# Patient Record
Sex: Female | Born: 1985 | Race: Black or African American | Hispanic: No | Marital: Single | Smoking: Never smoker
Health system: Southern US, Community
[De-identification: ages and names within clinical notes are randomized; demographics above are authoritative.]

## PROBLEM LIST (undated history)

## (undated) ENCOUNTER — Emergency Department (HOSPITAL_COMMUNITY): Admission: EM

## (undated) DIAGNOSIS — G40909 Epilepsy, unspecified, not intractable, without status epilepticus: Secondary | ICD-10-CM

## (undated) DIAGNOSIS — I10 Essential (primary) hypertension: Secondary | ICD-10-CM

## (undated) DIAGNOSIS — G4730 Sleep apnea, unspecified: Secondary | ICD-10-CM

## (undated) DIAGNOSIS — R569 Unspecified convulsions: Secondary | ICD-10-CM

## (undated) HISTORY — PX: TONSILLECTOMY AND ADENOIDECTOMY: SUR1326

## (undated) HISTORY — DX: Essential (primary) hypertension: I10

## (undated) HISTORY — DX: Morbid (severe) obesity due to excess calories: E66.01

---
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
## 2009-06-03 ENCOUNTER — Encounter: Admission: RE | Admit: 2009-06-03 | Discharge: 2009-06-03 | Payer: Self-pay | Admitting: Family Medicine

## 2009-10-20 ENCOUNTER — Encounter
Admission: RE | Admit: 2009-10-20 | Discharge: 2009-10-20 | Payer: Self-pay | Source: Home / Self Care | Attending: Family Medicine | Admitting: Family Medicine

## 2011-04-07 ENCOUNTER — Ambulatory Visit (HOSPITAL_COMMUNITY)
Admission: RE | Admit: 2011-04-07 | Discharge: 2011-04-07 | Disposition: A | Discharge status: Home / Self Care | Payer: Medicaid Other | Source: Ambulatory Visit | Attending: Internal Medicine | Admitting: Internal Medicine

## 2011-04-07 ENCOUNTER — Other Ambulatory Visit: Payer: Self-pay | Admitting: Internal Medicine

## 2011-04-07 DIAGNOSIS — R0602 Shortness of breath: Secondary | ICD-10-CM

## 2011-04-07 DIAGNOSIS — R079 Chest pain, unspecified: Secondary | ICD-10-CM | POA: Insufficient documentation

## 2011-05-16 ENCOUNTER — Encounter (HOSPITAL_COMMUNITY): Payer: Self-pay | Admitting: *Deleted

## 2011-05-16 ENCOUNTER — Emergency Department (HOSPITAL_COMMUNITY)
Admission: EM | Admit: 2011-05-16 | Discharge: 2011-05-16 | Disposition: A | Discharge status: Home / Self Care | Payer: Medicaid Other | Attending: Emergency Medicine | Admitting: Emergency Medicine

## 2011-05-16 DIAGNOSIS — W57XXXA Bitten or stung by nonvenomous insect and other nonvenomous arthropods, initial encounter: Secondary | ICD-10-CM

## 2011-05-16 DIAGNOSIS — L989 Disorder of the skin and subcutaneous tissue, unspecified: Secondary | ICD-10-CM | POA: Insufficient documentation

## 2011-05-16 HISTORY — DX: Epilepsy, unspecified, not intractable, without status epilepticus: G40.909

## 2011-05-16 HISTORY — DX: Unspecified convulsions: R56.9

## 2011-05-16 MED ORDER — HYDROCORTISONE 2.5 % EX LOTN: TOPICAL_LOTION | Freq: Two times a day (BID) | CUTANEOUS | Status: AC

## 2011-05-16 NOTE — ED Provider Notes (Signed)
History     CSN: [id number]  Arrival date & time 05/16/11  1243   First MD Initiated Contact with Patient 05/16/11 1352      2:12 PM HPI Shannon Huffman is a 26 y.o. female complaining of an insect bite. Reports she woke up this morning to find a itchy spot on her right forearm and right breast. Denies purulent drainage. Reports the spot on her right breast has resolved but the her forearm continues to itch. Denies change in soaps, lotions, detergencts, allergies, food contacts or sick contacts. Denies fever, SOB, wheezing, or cough  Patient is a 26 y.o. female presenting with rash. The history is provided by the patient.  Rash  This is a new problem. The problem has been gradually improving. The problem is associated with an unknown factor. There has been no fever. The rash is present on the right arm (right breast). The pain is at a severity of 0/10. Associated symptoms include itching. Pertinent negatives include no blisters, no pain and no weeping.    Past Medical History  Diagnosis Date  . Asthma   . Seizures   . Epilepsy     History reviewed. No pertinent past surgical history.  History reviewed. No pertinent family history.  History  Substance Use Topics  . Smoking status: Not on file  . Smokeless tobacco: Not on file  . Alcohol Use: No    OB History    Grav Para Term Preterm Abortions TAB SAB Ect Mult Living                  Review of Systems  Constitutional: Negative for fever and chills.  HENT: Negative for rhinorrhea.   Eyes: Negative for redness.  Respiratory: Negative for cough, shortness of breath and wheezing.   Cardiovascular: Negative for chest pain and palpitations.  Gastrointestinal: Negative for nausea.  Skin: Positive for itching and rash (insect bite).       Pruritus  All other systems reviewed and are negative.    Allergies  Review of patient's allergies indicates no known allergies.  Home Medications  No current outpatient  prescriptions on file.  BP 132/84  Pulse 84  Temp(Src) 98.9 F (37.2 C) (Oral)  Resp 20  SpO2 100%  LMP 03/23/2011  Physical Exam  Vitals reviewed. Constitutional: She is oriented to person, place, and time. Vital signs are normal. She appears well-developed and well-nourished. No distress.  HENT:  Head: Normocephalic and atraumatic.  Eyes: Pupils are equal, round, and reactive to light.  Neck: Neck supple.  Pulmonary/Chest: Effort normal.  Neurological: She is alert and oriented to person, place, and time.  Skin: Skin is warm and dry. No rash noted. No erythema. No pallor.       Pt has a large erythematous lesion on right anterior forearm. No fluctuance, masses or drainage. Right breast: no lesion seen  Psychiatric: She has a normal mood and affect. Her behavior is normal.    ED Course  Procedures   MDM   Will d/c with Hydrocortisone cream and advised return for worsening symptoms if needed. Rash/insect bite does not appear to be infected. Advised against antibiotic treatment at this time. Pt voices understanding and is ready for d/c      Thomasene Lot, PA-C 05/16/11 1420

## 2011-05-16 NOTE — ED Notes (Signed)
Pt reports waking up this morning and noticing bite mark and swelling to right forearm and right breast. Pt reports itching to areas.

## 2011-05-16 NOTE — Discharge Instructions (Signed)
Insect Bite Mosquitoes, flies, fleas, bedbugs, and many other insects can bite. Insect bites are different from insect stings. A sting is when venom is injected into the skin. Some insect bites can transmit infectious diseases. SYMPTOMS  Insect bites usually turn red, swell, and itch for 2 to 4 days. They often go away on their own. TREATMENT  Your caregiver may prescribe antibiotic medicines if a bacterial infection develops in the bite. HOME CARE INSTRUCTIONS  Do not scratch the bite area.   Keep the bite area clean and dry. Wash the bite area thoroughly with soap and water.   Put ice or cool compresses on the bite area.   Put ice in a plastic bag.   Place a towel between your skin and the bag.   Leave the ice on for 20 minutes, 4 times a day for the first 2 to 3 days, or as directed.   You may apply a baking soda paste, cortisone cream, or calamine lotion to the bite area as directed by your caregiver. This can help reduce itching and swelling.   Only take over-the-counter or prescription medicines as directed by your caregiver.   If you are given antibiotics, take them as directed. Finish them even if you start to feel better.  You may need a tetanus shot if:  You cannot remember when you had your last tetanus shot.   You have never had a tetanus shot.   The injury broke your skin.  If you get a tetanus shot, your arm may swell, get red, and feel warm to the touch. This is common and not a problem. If you need a tetanus shot and you choose not to have one, there is a rare chance of getting tetanus. Sickness from tetanus can be serious. SEEK IMMEDIATE MEDICAL CARE IF:   You have increased pain, redness, or swelling in the bite area.   You see a red line on the skin coming from the bite.   You have a fever.   You have joint pain.   You have a headache or neck pain.   You have unusual weakness.   You have a rash.   You have chest pain or shortness of breath.   You  have abdominal pain, nausea, or vomiting.   You feel unusually tired or sleepy.  MAKE SURE YOU:   Understand these instructions.   Will watch your condition.   Will get help right away if you are not doing well or get worse.  Document Released: 02/12/2004 Document Revised: 12/24/2010 Document Reviewed: 08/05/2010 [employer] Patient Information 2012 [employer]. 

## 2011-05-18 NOTE — ED Provider Notes (Signed)
Medical screening examination/treatment/procedure(s) were performed by non-physician practitioner and as supervising physician I was immediately available for consultation/collaboration.  Raeford Razor, MD 05/18/11 [phone]

## 2011-10-14 ENCOUNTER — Encounter (HOSPITAL_COMMUNITY): Payer: Self-pay | Admitting: Emergency Medicine

## 2011-10-14 ENCOUNTER — Emergency Department (HOSPITAL_COMMUNITY)
Admission: EM | Admit: 2011-10-14 | Discharge: 2011-10-14 | Disposition: A | Discharge status: Home / Self Care | Payer: Medicaid Other | Attending: Emergency Medicine | Admitting: Emergency Medicine

## 2011-10-14 ENCOUNTER — Emergency Department (HOSPITAL_COMMUNITY): Payer: Medicaid Other

## 2011-10-14 DIAGNOSIS — G40909 Epilepsy, unspecified, not intractable, without status epilepticus: Secondary | ICD-10-CM | POA: Insufficient documentation

## 2011-10-14 DIAGNOSIS — M25561 Pain in right knee: Secondary | ICD-10-CM

## 2011-10-14 DIAGNOSIS — M25569 Pain in unspecified knee: Secondary | ICD-10-CM | POA: Insufficient documentation

## 2011-10-14 DIAGNOSIS — J45909 Unspecified asthma, uncomplicated: Secondary | ICD-10-CM | POA: Insufficient documentation

## 2011-10-14 DIAGNOSIS — G4730 Sleep apnea, unspecified: Secondary | ICD-10-CM | POA: Insufficient documentation

## 2011-10-14 HISTORY — DX: Sleep apnea, unspecified: G47.30

## 2011-10-14 MED ORDER — TRAMADOL HCL 50 MG PO TABS: 50.0000 mg | ORAL_TABLET | Freq: Four times a day (QID) | ORAL | Status: DC | PRN

## 2011-10-14 MED ORDER — TRIAMCINOLONE ACETONIDE 0.1 % EX CREA: TOPICAL_CREAM | Freq: Two times a day (BID) | CUTANEOUS | Status: DC

## 2011-10-14 MED ORDER — OXYCODONE-ACETAMINOPHEN 5-325 MG PO TABS
2.0000 | ORAL_TABLET | Freq: Once | ORAL | Status: AC
  Administered 2011-10-14: 2 via ORAL
  Filled 2011-10-14: qty 2

## 2011-10-14 NOTE — ED Provider Notes (Signed)
Medical screening examination/treatment/procedure(s) were performed by non-physician practitioner and as supervising physician I was immediately available for consultation/collaboration.   Lyanne Co, MD 10/14/11 [phone]

## 2011-10-14 NOTE — ED Provider Notes (Signed)
History     CSN: [id number]  Arrival date & time 10/14/11  4098   First MD Initiated Contact with Patient 10/14/11 1005      Chief Complaint  Patient presents with  . Knee Pain    (Consider location/radiation/quality/duration/timing/severity/associated sxs/prior treatment) HPI Comments: Shannon Huffman 26 y.o. female   The chief complaint is: Patient presents with:   Knee Pain   The patient has medical history significant for:   Past Medical History:   Asthma                                                       Seizures                                                     Epilepsy                                                     Sleep apnea                                                 Patient presents with right knee pain X 1 month. Patient states that she was doing jumping jacks and came down when her knee gave out on her. Patient describes the pain mostly at the patella, 8/10, without radiation or transmission. She has tried 400mg  of ibuprofen and bengay without relief. Denies fever or chills. Denies NVD or abdominal pain. Denies dysuria, vaginal discharge. Denies joint swelling or gait issue.      The history is provided by the patient. No language interpreter was used.    Past Medical History  Diagnosis Date  . Asthma   . Seizures   . Epilepsy   . Sleep apnea     History reviewed. No pertinent past surgical history.  History reviewed. No pertinent family history.  History  Substance Use Topics  . Smoking status: Not on file  . Smokeless tobacco: Not on file  . Alcohol Use: No    OB History    Grav Para Term Preterm Abortions TAB SAB Ect Mult Living                  Review of Systems  Constitutional: Negative for fever and chills.  Gastrointestinal: Negative for nausea, vomiting, abdominal pain and diarrhea.  Genitourinary: Negative for dysuria and vaginal discharge.  Musculoskeletal: Positive for arthralgias. Negative for joint  swelling.  All other systems reviewed and are negative.    Allergies  Adhesive and Latex  Home Medications   Current Outpatient Rx  Name Route Sig Dispense Refill  . ACETAMINOPHEN 500 MG PO TABS Oral Take 500 mg by mouth every 6 (six) hours as needed. For pain    . ALBUTEROL SULFATE HFA 108 (90 BASE) MCG/ACT IN AERS Inhalation Inhale 2 puffs into the lungs every 6 (six) hours as needed. For  shortness of breath    . FERROUS GLUCONATE 324 (38 FE) MG PO TABS Oral Take 324 mg by mouth daily with breakfast.    . HYDROCORTISONE 2.5 % EX LOTN Topical Apply topically 2 (two) times daily. 59 mL 0  . IBUPROFEN 200 MG PO TABS Oral Take 400 mg by mouth every 6 (six) hours as needed. For pain    . MOMETASONE FUROATE 50 MCG/ACT NA SUSP Nasal Place 2 sprays into the nose daily.    . ADULT MULTIVITAMIN W/MINERALS CH Oral Take 1 tablet by mouth daily.    Marland Kitchen PHENTERMINE HCL 37.5 MG PO TABS Oral Take 37.5 mg by mouth daily before breakfast.      BP 156/96  Pulse 106  Temp 99.1 F (37.3 C) (Oral)  Resp 18  Ht 5\' 7"  (1.702 m)  Wt 335 lb (151.955 kg)  BMI 52.47 kg/m2  SpO2 100%  LMP 07/14/2011  Physical Exam  Nursing note and vitals reviewed. Constitutional: She appears well-developed and well-nourished. No distress.  HENT:  Head: Normocephalic and atraumatic.  Eyes: Conjunctivae normal and EOM are normal. No scleral icterus.  Neck: Normal range of motion. Neck supple.  Cardiovascular: Regular rhythm and normal heart sounds.   Pulmonary/Chest: Effort normal and breath sounds normal.  Abdominal: Soft. Bowel sounds are normal. There is no tenderness.  Musculoskeletal: Normal range of motion. She exhibits tenderness. She exhibits no edema.       Tenderness to palpation of the anterior patella. No erythema or increased warmth.  Neurological: She is alert.  Skin: Skin is warm and dry.    ED Course  Procedures (including critical care time)  Labs Reviewed - No data to display No results  found.   1. Right knee pain      MDM  Patient presented with right knee pain x 1 month s/p exercise. Patient given pain medication in ED with mild improvement. Patient given instructions on rest, ice, and elevation with Rx for antiinflammatory, with referral to ortho on call if she does not improve in 5 days. Return precautions given verbally and in discharge summary. Imaging unremarkable. No red flags for fracture, dislocation, septic joint, or Reiter's syndrome.        Pixie Casino, PA-C 10/14/11 1323

## 2011-10-14 NOTE — ED Notes (Addendum)
Pt injured knee while exercising several weeks ago.  Pt has used OTC Tylenol and Ibuprofen and Bengay without relief.  Pt able to ambulate but with increased pain.

## 2012-01-03 ENCOUNTER — Ambulatory Visit: Payer: Medicaid Other | Attending: Specialist | Admitting: Physical Therapy

## 2012-01-03 DIAGNOSIS — M25669 Stiffness of unspecified knee, not elsewhere classified: Secondary | ICD-10-CM | POA: Insufficient documentation

## 2012-01-03 DIAGNOSIS — M25569 Pain in unspecified knee: Secondary | ICD-10-CM | POA: Insufficient documentation

## 2012-01-03 DIAGNOSIS — IMO0001 Reserved for inherently not codable concepts without codable children: Secondary | ICD-10-CM | POA: Insufficient documentation

## 2012-01-05 ENCOUNTER — Ambulatory Visit: Payer: Medicaid Other | Admitting: Physical Therapy

## 2012-01-13 ENCOUNTER — Ambulatory Visit: Payer: Medicaid Other | Admitting: Physical Therapy

## 2012-01-17 ENCOUNTER — Ambulatory Visit: Payer: Medicaid Other | Admitting: Physical Therapy

## 2012-01-18 ENCOUNTER — Ambulatory Visit: Payer: Medicaid Other | Admitting: Physical Therapy

## 2012-01-19 HISTORY — PX: OTHER SURGICAL HISTORY: SHX169

## 2012-05-24 ENCOUNTER — Encounter (HOSPITAL_COMMUNITY): Payer: Self-pay | Admitting: *Deleted

## 2012-05-24 ENCOUNTER — Emergency Department (HOSPITAL_COMMUNITY)
Admission: EM | Admit: 2012-05-24 | Discharge: 2012-05-25 | Disposition: A | Discharge status: Home / Self Care | Payer: Medicaid Other | Attending: Emergency Medicine | Admitting: Emergency Medicine

## 2012-05-24 DIAGNOSIS — J45909 Unspecified asthma, uncomplicated: Secondary | ICD-10-CM | POA: Insufficient documentation

## 2012-05-24 DIAGNOSIS — R10819 Abdominal tenderness, unspecified site: Secondary | ICD-10-CM | POA: Insufficient documentation

## 2012-05-24 DIAGNOSIS — Z8669 Personal history of other diseases of the nervous system and sense organs: Secondary | ICD-10-CM | POA: Insufficient documentation

## 2012-05-24 DIAGNOSIS — Z79899 Other long term (current) drug therapy: Secondary | ICD-10-CM | POA: Insufficient documentation

## 2012-05-24 DIAGNOSIS — N390 Urinary tract infection, site not specified: Secondary | ICD-10-CM | POA: Insufficient documentation

## 2012-05-24 DIAGNOSIS — N920 Excessive and frequent menstruation with regular cycle: Secondary | ICD-10-CM | POA: Insufficient documentation

## 2012-05-24 DIAGNOSIS — IMO0002 Reserved for concepts with insufficient information to code with codable children: Secondary | ICD-10-CM | POA: Insufficient documentation

## 2012-05-24 DIAGNOSIS — Z91040 Latex allergy status: Secondary | ICD-10-CM | POA: Insufficient documentation

## 2012-05-24 DIAGNOSIS — E669 Obesity, unspecified: Secondary | ICD-10-CM | POA: Insufficient documentation

## 2012-05-24 DIAGNOSIS — R300 Dysuria: Secondary | ICD-10-CM | POA: Insufficient documentation

## 2012-05-24 NOTE — ED Notes (Addendum)
Pt reports she was diagnosed with a "bladder infection last Thursday." reports on Saturday noticing blood "when I wipe." pt unsure if blood is from vagina or urethra. Denies dysuria. Reports slight abd tenderness. States "I  Just went off my period last Thursday."

## 2012-05-25 LAB — POCT I-STAT, CHEM 8
BUN: 12 mg/dL (ref 6–23)
Calcium, Ion: 1.15 mmol/L (ref 1.12–1.23)
HCT: 38 % (ref 36.0–46.0)
Sodium: 138 mEq/L (ref 135–145)
TCO2: 28 mmol/L (ref 0–100)

## 2012-05-25 LAB — URINALYSIS, ROUTINE W REFLEX MICROSCOPIC
Bilirubin Urine: NEGATIVE
Ketones, ur: NEGATIVE mg/dL
Nitrite: NEGATIVE
Specific Gravity, Urine: 1.022 (ref 1.005–1.030)
Urobilinogen, UA: 0.2 mg/dL (ref 0.0–1.0)
pH: 6 (ref 5.0–8.0)

## 2012-05-25 LAB — URINE MICROSCOPIC-ADD ON

## 2012-05-25 MED ORDER — SULFAMETHOXAZOLE-TRIMETHOPRIM 800-160 MG PO TABS: 1.0000 | ORAL_TABLET | Freq: Two times a day (BID) | ORAL | Status: DC

## 2012-05-25 NOTE — ED Provider Notes (Signed)
History     CSN: [id number]  Arrival date & time 05/24/12  2232   First MD Initiated Contact with Patient 05/25/12 0143      Chief Complaint  Patient presents with  . Vaginal Bleeding    (Consider location/radiation/quality/duration/timing/severity/associated sxs/prior treatment) HPI Shannon Huffman is a 27 y.o. female with a history of irregular menstrual cycles presents emergency department complaining of menorrhagia and intermittent dysuria.  Patient reports that her last menstrual period began April 27 and finished May 1st.  Patient developed some mild suprapubic tenderness and dysuria which she was treated with cranberry juice and Azo.  Patient reports having another menstrual cycle beginning on may third.  She denies current urinary symptoms stating that they have resolved but requests an antibiotic for what she thinks is a urinary tract infection.  Patient denies any fevers, night sweats, chills vaginal discharge or dyspareunia.  Past Medical History  Diagnosis Date  . Asthma   . Seizures   . Epilepsy   . Sleep apnea     History reviewed. No pertinent past surgical history.  History reviewed. No pertinent family history.  History  Substance Use Topics  . Smoking status: Not on file  . Smokeless tobacco: Not on file  . Alcohol Use: No    OB History   Grav Para Term Preterm Abortions TAB SAB Ect Mult Living                  Review of Systems  All other systems reviewed and are negative.    Allergies  Adhesive and Latex  Home Medications   Current Outpatient Rx  Name  Route  Sig  Dispense  Refill  . albuterol (PROVENTIL HFA;VENTOLIN HFA) 108 (90 BASE) MCG/ACT inhaler   Inhalation   Inhale 2 puffs into the lungs every 6 (six) hours as needed. For shortness of breath         . ferrous gluconate (FERGON) 324 MG tablet   Oral   Take 324 mg by mouth daily with breakfast.         . mometasone (NASONEX) 50 MCG/ACT nasal spray   Nasal   Place 2 sprays  into the nose daily.         . phentermine (ADIPEX-P) 37.5 MG tablet   Oral   Take 37.5 mg by mouth daily before breakfast.           BP 135/92  Pulse 94  Temp(Src) 98.9 F (37.2 C) (Oral)  Resp 16  SpO2 100%  LMP 05/14/2012  Physical Exam  Nursing note and vitals reviewed. Constitutional: She is oriented to person, place, and time. She appears well-developed and well-nourished. No distress.  HENT:  Head: Normocephalic and atraumatic.  Eyes: Conjunctivae and EOM are normal.  Neck: Normal range of motion.  Cardiovascular:  Regular rate rhythm.  Pulmonary/Chest: Effort normal.  Abdominal:  Obese.  Mild suprapubic tenderness to palpation.  No peritoneal signs.  Genitourinary:  Pelvic exam deferred  Musculoskeletal: Normal range of motion.  Neurological: She is alert and oriented to person, place, and time.  Skin: Skin is warm and dry. No rash noted. She is not diaphoretic.  Psychiatric: She has a normal mood and affect. Her behavior is normal.    ED Course  Procedures (including critical care time)  Labs Reviewed  URINALYSIS, ROUTINE W REFLEX MICROSCOPIC - Abnormal; Notable for the following:    APPearance CLOUDY (*)    Hgb urine dipstick LARGE (*)    Leukocytes, UA  MODERATE (*)    All other components within normal limits  URINE MICROSCOPIC-ADD ON - Abnormal; Notable for the following:    Bacteria, UA FEW (*)    All other components within normal limits  URINE CULTURE  POCT I-STAT, CHEM 8   No results found.   No diagnosis found.    MDM  Menorrhagia Possible urinary tract infection  Patient will be treated with antibiotic for urinary tract infection.  She already has followup scheduled with her primary care physician to be placed on estrogen therapy to help regulate her periods which have been chronically irregular.  Pelvic exam deferred as patient reports that she hurt he had one recently.  Discuss importance of using protection when sexually active.   Patient verbalizes she always does.  At this time there does not appear to be any evidence of an acute emergency medical condition and the patient appears stable for discharge with appropriate outpatient follow up.Diagnosis was discussed with patient who verbalizes understanding and is agreeable to discharge.   Jaci Carrel, PA-C 05/25/12 0230

## 2012-05-25 NOTE — ED Provider Notes (Signed)
Medical screening examination/treatment/procedure(s) were performed by non-physician practitioner and as supervising physician I was immediately available for consultation/collaboration.  Jones Skene, M.D.   Jones Skene, MD 05/25/12 [phone]

## 2012-05-26 LAB — URINE CULTURE

## 2012-08-14 ENCOUNTER — Ambulatory Visit (INDEPENDENT_AMBULATORY_CARE_PROVIDER_SITE_OTHER): Payer: Medicaid Other | Admitting: Obstetrics and Gynecology

## 2012-08-14 ENCOUNTER — Encounter: Payer: Self-pay | Admitting: Obstetrics and Gynecology

## 2012-08-14 ENCOUNTER — Other Ambulatory Visit (HOSPITAL_COMMUNITY)
Admission: RE | Admit: 2012-08-14 | Discharge: 2012-08-14 | Disposition: A | Discharge status: Home / Self Care | Payer: Medicaid Other | Source: Ambulatory Visit | Attending: Family Medicine | Admitting: Family Medicine

## 2012-08-14 VITALS — BP 140/94 | HR 89 | Temp 98.4°F | Ht 66.0 in | Wt 335.0 lb

## 2012-08-14 DIAGNOSIS — R030 Elevated blood-pressure reading, without diagnosis of hypertension: Secondary | ICD-10-CM

## 2012-08-14 DIAGNOSIS — Z01419 Encounter for gynecological examination (general) (routine) without abnormal findings: Secondary | ICD-10-CM | POA: Insufficient documentation

## 2012-08-14 DIAGNOSIS — Z6841 Body Mass Index (BMI) 40.0 and over, adult: Secondary | ICD-10-CM

## 2012-08-14 DIAGNOSIS — Z113 Encounter for screening for infections with a predominantly sexual mode of transmission: Secondary | ICD-10-CM | POA: Insufficient documentation

## 2012-08-14 DIAGNOSIS — Z3009 Encounter for other general counseling and advice on contraception: Secondary | ICD-10-CM

## 2012-08-14 MED ORDER — PRENATAL VITAMINS 0.8 MG PO TABS: 1.0000 | ORAL_TABLET | Freq: Every day | ORAL | Status: DC

## 2012-08-14 NOTE — Patient Instructions (Addendum)

## 2012-08-14 NOTE — Progress Notes (Signed)
Annual Exam-Premenopausal Patient presents for annual exam. The patient has no complaints today. The patient is sexually active. GYN screening history: last pap: was normal. The patient wears seatbelts: yes. The patient participates in regular exercise: not asked. Has the patient ever been transfused or tattooed?: yes. The patient reports that there is not domestic violence in her life.  SUBJECTIVE:  27 y.o. female for annual routine Pap and checkup. Current Outpatient Prescriptions  Medication Sig Dispense Refill  . albuterol (PROVENTIL HFA;VENTOLIN HFA) 108 (90 BASE) MCG/ACT inhaler Inhale 2 puffs into the lungs every 6 (six) hours as needed. For shortness of breath      . ferrous gluconate (FERGON) 324 MG tablet Take 324 mg by mouth daily with breakfast.      . mometasone (NASONEX) 50 MCG/ACT nasal spray Place 2 sprays into the nose daily.      . phentermine (ADIPEX-P) 37.5 MG tablet Take 37.5 mg by mouth daily before breakfast.      . Prenatal Multivit-Min-Fe-FA (PRENATAL VITAMINS) 0.8 MG tablet Take 1 tablet by mouth daily.  90 tablet  3  . sulfamethoxazole-trimethoprim (BACTRIM DS,SEPTRA DS) 800-160 MG per tablet Take 1 tablet by mouth 2 (two) times daily.  6 tablet  0   No current facility-administered medications for this visit.   Allergies: Adhesive and Latex  Patient's last menstrual period was 08/10/2012.  ROS:  Feeling well. No dyspnea or chest pain on exertion.  No abdominal pain, change in bowel habits, black or bloody stools.  No urinary tract symptoms. + yeast infections occasionally with cycle  GYN ROS: normal menses, no abnormal bleeding, pelvic pain or discharge. No neurological complaints.  OBJECTIVE:  The patient appears well, alert, oriented x 3, in no distress. Morbidly obese pt BP 140/94  Pulse 89  Temp(Src) 98.4 F (36.9 C) (Oral)  Ht 5\' 6"  (1.676 m)  Wt 335 lb (151.955 kg)  BMI 54.1 kg/m2  LMP 08/10/2012 ENT normal.  Neck supple. No adenopathy or  thyromegaly. PERLA. Lungs are clear, good air entry, no wheezes, rhonchi or rales. S1 and S2 normal, no murmurs, regular rate and rhythm.  Abdomen soft without tenderness, guarding, mass or organomegaly.  Extremities show no edema, normal peripheral pulses. Neurological is normal, no focal findings.  BREAST EXAM: not examined  PELVIC EXAM: normal external genitalia, vulva, vagina, cervix- upper portion visualized, uterus and adnexa, exam limited by obesity  ASSESSMENT:  Shannon Huffman is a 27 y.o. G0P0000 here for well woman. Pt is actively trying to get pregnant with partner. Is not using birth control and having unprotected sex.   #Elevated Blood pressure: isolated event - recommend pt follow up for reevaluation. Pt blood pressure elevated at todays visit. Currently pt has not been on medication for BP. On review of previous BPs likely white coat HTN. However pt is prehypertensive and will need to be monitored. Pt currently working on diet and exercise and with continued weight loss likely improve. And CPAP usage.  #Family Planning: pt agrees to start prenatal vitamins. Will start.  # Morbid Obesity: PT BMI >50. Recommend pt continue attempting weight loss. Pt has used medications in past with 70lbs weight loss.  Discussed life style intervention. Continue use of CPAP   Tawana Scale, MD [facility] Fellow

## 2014-06-16 ENCOUNTER — Encounter (HOSPITAL_COMMUNITY): Payer: Self-pay

## 2014-06-16 ENCOUNTER — Emergency Department (HOSPITAL_COMMUNITY)
Admission: EM | Admit: 2014-06-16 | Discharge: 2014-06-16 | Disposition: A | Discharge status: Home / Self Care | Payer: Medicaid Other | Attending: Emergency Medicine | Admitting: Emergency Medicine

## 2014-06-16 DIAGNOSIS — R319 Hematuria, unspecified: Secondary | ICD-10-CM

## 2014-06-16 DIAGNOSIS — R300 Dysuria: Secondary | ICD-10-CM | POA: Diagnosis present

## 2014-06-16 DIAGNOSIS — D649 Anemia, unspecified: Secondary | ICD-10-CM

## 2014-06-16 DIAGNOSIS — Z79899 Other long term (current) drug therapy: Secondary | ICD-10-CM | POA: Diagnosis not present

## 2014-06-16 DIAGNOSIS — Z3202 Encounter for pregnancy test, result negative: Secondary | ICD-10-CM | POA: Diagnosis not present

## 2014-06-16 DIAGNOSIS — J45909 Unspecified asthma, uncomplicated: Secondary | ICD-10-CM | POA: Diagnosis not present

## 2014-06-16 DIAGNOSIS — N390 Urinary tract infection, site not specified: Secondary | ICD-10-CM | POA: Insufficient documentation

## 2014-06-16 DIAGNOSIS — Z91040 Latex allergy status: Secondary | ICD-10-CM | POA: Insufficient documentation

## 2014-06-16 DIAGNOSIS — Z8669 Personal history of other diseases of the nervous system and sense organs: Secondary | ICD-10-CM | POA: Insufficient documentation

## 2014-06-16 LAB — COMPREHENSIVE METABOLIC PANEL
ALT: 44 U/L (ref 14–54)
ANION GAP: 7 (ref 5–15)
AST: 38 U/L (ref 15–41)
Albumin: 3.7 g/dL (ref 3.5–5.0)
Alkaline Phosphatase: 90 U/L (ref 38–126)
BUN: 9 mg/dL (ref 6–20)
CALCIUM: 8.9 mg/dL (ref 8.9–10.3)
CO2: 26 mmol/L (ref 22–32)
CREATININE: 0.86 mg/dL (ref 0.44–1.00)
Chloride: 106 mmol/L (ref 101–111)
GLUCOSE: 90 mg/dL (ref 65–99)
POTASSIUM: 4.1 mmol/L (ref 3.5–5.1)
Sodium: 139 mmol/L (ref 135–145)
Total Bilirubin: 0.2 mg/dL — ABNORMAL LOW (ref 0.3–1.2)
Total Protein: 7.3 g/dL (ref 6.5–8.1)

## 2014-06-16 LAB — URINALYSIS, ROUTINE W REFLEX MICROSCOPIC
BILIRUBIN URINE: NEGATIVE
Glucose, UA: NEGATIVE mg/dL
Ketones, ur: NEGATIVE mg/dL
Nitrite: NEGATIVE
Protein, ur: 30 mg/dL — AB
SPECIFIC GRAVITY, URINE: 1.022 (ref 1.005–1.030)
Urobilinogen, UA: 1 mg/dL (ref 0.0–1.0)
pH: 7 (ref 5.0–8.0)

## 2014-06-16 LAB — URINE MICROSCOPIC-ADD ON

## 2014-06-16 LAB — POC URINE PREG, ED: PREG TEST UR: NEGATIVE

## 2014-06-16 LAB — CBC
HCT: 37.9 % (ref 36.0–46.0)
HEMOGLOBIN: 11.9 g/dL — AB (ref 12.0–15.0)
MCH: 22.2 pg — ABNORMAL LOW (ref 26.0–34.0)
MCHC: 31.4 g/dL (ref 30.0–36.0)
MCV: 70.8 fL — AB (ref 78.0–100.0)
PLATELETS: 188 10*3/uL (ref 150–400)
RBC: 5.35 MIL/uL — ABNORMAL HIGH (ref 3.87–5.11)
RDW: 17.9 % — AB (ref 11.5–15.5)
WBC: 3.7 10*3/uL — AB (ref 4.0–10.5)

## 2014-06-16 MED ORDER — PHENAZOPYRIDINE HCL 200 MG PO TABS: 200.0000 mg | ORAL_TABLET | Freq: Three times a day (TID) | ORAL | Status: DC

## 2014-06-16 MED ORDER — SULFAMETHOXAZOLE-TRIMETHOPRIM 800-160 MG PO TABS: 1.0000 | ORAL_TABLET | Freq: Two times a day (BID) | ORAL | Status: AC

## 2014-06-16 NOTE — ED Notes (Signed)
Attempted blood draw, was unsuccessful  

## 2014-06-16 NOTE — ED Notes (Signed)
Pt ambulating independently w/ steady gait on d/c in no acute distress, A&Ox4. D/c instructions reviewed w/ pt - pt denies any further questions or concerns at present. Rx given x2  

## 2014-06-16 NOTE — ED Provider Notes (Signed)
CSN: [id number]     Arrival date & time 06/16/14  1730 History   First MD Initiated Contact with Patient 06/16/14 2044     Chief Complaint  Patient presents with  . Dysuria  . Urinary Frequency     (Consider location/radiation/quality/duration/timing/severity/associated sxs/prior Treatment) HPI  Shannon Huffman is a 29 year old morbidly obese female, with history of yeast infection, UTI's, and irregular periods, who presents to [facility] ED today for 2 days of painful urination with increased frequency and hematuria. She has some mild suprapubic pressure but denies any flank pain, nausea, vomiting, fever or chills.  She denies the possibility of STD exposure because she had one sexual partner however she uses no barrier method of contraception and has no other method of birth control. She reports trouble with multiple yeast infections, but has not had a UTI for about a year.  She has asked about STD testing, but does not want a pelvic exam.  She denies any vaginal burning, itching, or discharge.    Past Medical History  Diagnosis Date  . Asthma   . Seizures   . Epilepsy   . Sleep apnea   . Morbid obesity    Past Surgical History  Procedure Laterality Date  . Right knee surgery Right Jan 2014  . Tonsillectomy and adenoidectomy  childhood   Family History  Problem Relation Age of Onset  . Diabetes Father   . Hypertension Mother    History  Substance Use Topics  . Smoking status: Never Smoker   . Smokeless tobacco: Never Used  . Alcohol Use: No   OB History    Gravida Para Term Preterm AB TAB SAB Ectopic Multiple Living   0 0 0 0 0 0 0 0 0 0      Review of Systems  All other systems reviewed and are negative.     Allergies  Adhesive and Latex  Home Medications   Prior to Admission medications   Medication Sig Start Date End Date Taking? Authorizing Provider  albuterol (PROVENTIL HFA;VENTOLIN HFA) 108 (90 BASE) MCG/ACT inhaler Inhale 2 puffs into the lungs every 6 (six)  hours as needed. For shortness of breath   Yes Historical Provider, MD  mometasone (NASONEX) 50 MCG/ACT nasal spray Place 2 sprays into the nose daily as needed (nasal congestion).    Yes Historical Provider, MD  Prenatal Multivit-Min-Fe-FA (PRENATAL VITAMINS) 0.8 MG tablet Take 1 tablet by mouth daily. Patient not taking: Reported on 06/16/2014 08/14/12   Minta BalsamMichael R Odom, MD  sulfamethoxazole-trimethoprim (BACTRIM DS,SEPTRA DS) 800-160 MG per tablet Take 1 tablet by mouth 2 (two) times daily. Patient not taking: Reported on 06/16/2014 05/25/12   Lisette Paz, PA-C   BP 171/91 mmHg  Pulse 107  Temp(Src) 98.6 F (37 C) (Oral)  Resp 16  SpO2 100%  LMP 04/19/2014 Physical Exam  Constitutional: She is oriented to person, place, and time. She appears well-developed and well-nourished. No distress.  HENT:  Head: Normocephalic and atraumatic.  Nose: Nose normal.  Mouth/Throat: Oropharynx is clear and moist. No oropharyngeal exudate.  Eyes: Conjunctivae and EOM are normal. Pupils are equal, round, and reactive to light.  Neck: Normal range of motion.  Cardiovascular: Normal rate, regular rhythm, normal heart sounds and intact distal pulses.  Exam reveals no gallop and no friction rub.   No murmur heard. Pulmonary/Chest: Effort normal and breath sounds normal. No respiratory distress. She has no wheezes. She has no rales. She exhibits no tenderness.  Abdominal: Soft. Bowel sounds are normal.  She exhibits no distension and no mass. There is no tenderness. There is no rebound and no guarding.  abd soft obese, no CVA tenderness, no suprapubic tenderness  Musculoskeletal: Normal range of motion. She exhibits no edema or tenderness.  Neurological: She is alert and oriented to person, place, and time.  Skin: Skin is warm and dry. No rash noted. She is not diaphoretic. No erythema. No pallor.  Psychiatric: She has a normal mood and affect. Her behavior is normal. Judgment and thought content normal.  Nursing  note and vitals reviewed.   ED Course  Procedures (including critical care time) Labs Review Labs Reviewed  CBC - Abnormal; Notable for the following:    WBC 3.7 (*)    RBC 5.35 (*)    Hemoglobin 11.9 (*)    MCV 70.8 (*)    MCH 22.2 (*)    RDW 17.9 (*)    All other components within normal limits  COMPREHENSIVE METABOLIC PANEL - Abnormal; Notable for the following:    Total Bilirubin 0.2 (*)    All other components within normal limits  URINALYSIS, ROUTINE W REFLEX MICROSCOPIC (NOT AT [facility]) - Abnormal; Notable for the following:    APPearance CLOUDY (*)    Hgb urine dipstick MODERATE (*)    Protein, ur 30 (*)    Leukocytes, UA LARGE (*)    All other components within normal limits  URINE MICROSCOPIC-ADD ON - Abnormal; Notable for the following:    Squamous Epithelial / LPF FEW (*)    Bacteria, UA FEW (*)    All other components within normal limits  POC URINE PREG, ED    Imaging Review No results found.   EKG Interpretation None      MDM   Final diagnoses:  None    Pt CC painful urination with increased frequency and hematuria.  Denies any signs of pyelo, such as increasing abdominal pain, back pain, N/V, fever or chills.  I suspect this is an uncomplicated UTI.  She does not want a pelvic exam and does not have any symptoms of STD infection or vaginitis.  I will try and add on a chlamydia test.  Her vitals in triage are unremarkable except for mild tachycardia. She has a history of anemia and has been noncompliant with her iron supplements. She will drink some fluids and we will recheck her heart rate. She denies any shortness of breath, weakness or fatigue.  Will repeat vitals and discharged home with prescription for macrobid.  She does have follow-up with her OB/GYN within the next month.  10:16 PM Patient received some IV fluids, blood pressure and heart rate are now within normal limits. Filed Vitals:   06/16/14 1736 06/16/14 2145  BP: 171/91 131/83   Pulse: 107 78  Temp: 98.6 F (37 C) 98.3 F (36.8 C)  TempSrc: Oral Oral  Resp: 16 16  SpO2: 100% 100%   After oral and IV hydration her tachycardia resolved, she was given Abx, was stable for discharge.  Danelle Berry, PA-C 06/18/14 9147  Bethann Berkshire, MD 06/20/14 1130

## 2014-06-16 NOTE — ED Notes (Signed)
Pt provided w/ sprite

## 2014-06-16 NOTE — Discharge Instructions (Signed)
Urinary Tract Infection A urinary tract infection (UTI) can occur any place along the urinary tract. The tract includes the kidneys, ureters, bladder, and urethra. A type of germ called bacteria often causes a UTI. UTIs are often helped with antibiotic medicine.  HOME CARE   If given, take antibiotics as told by your doctor. Finish them even if you start to feel better.  Drink enough fluids to keep your pee (urine) clear or pale yellow.  Avoid tea, drinks with caffeine, and bubbly (carbonated) drinks.  Pee often. Avoid holding your pee in for a long time.  Pee before and after having sex (intercourse).  Wipe from front to back after you poop (bowel movement) if you are a woman. Use each tissue only once. GET HELP RIGHT AWAY IF:   You have back pain.  You have lower belly (abdominal) pain.  You have chills.  You feel sick to your stomach (nauseous).  You throw up (vomit).  Your burning or discomfort with peeing does not go away.  You have a fever.  Your symptoms are not better in 3 days. MAKE SURE YOU:   Understand these instructions.  Will watch your condition.  Will get help right away if you are not doing well or get worse. Document Released: 06/23/2007 Document Revised: 09/29/2011 Document Reviewed: 08/05/2011 [facility] Patient Information 2015 [place]. This information is not intended to replace advice given to you by your health care provider. Make sure you discuss any questions you have with your health care provider.  Anemia, Nonspecific Anemia is a condition in which the concentration of red blood cells or hemoglobin in the blood is below normal. Hemoglobin is a substance in red blood cells that carries oxygen to the tissues of the body. Anemia results in not enough oxygen reaching these tissues.  CAUSES  Common causes of anemia include:   Excessive bleeding. Bleeding may be internal or external. This includes excessive bleeding from periods (in women) or  from the intestine.   Poor nutrition.   Chronic kidney, thyroid, and liver disease.  Bone marrow disorders that decrease red blood cell production.  Cancer and treatments for cancer.  HIV, AIDS, and their treatments.  Spleen problems that increase red blood cell destruction.  Blood disorders.  Excess destruction of red blood cells due to infection, medicines, and autoimmune disorders. SIGNS AND SYMPTOMS   Minor weakness.   Dizziness.   Headache.  Palpitations.   Shortness of breath, especially with exercise.   Paleness.  Cold sensitivity.  Indigestion.  Nausea.  Difficulty sleeping.  Difficulty concentrating. Symptoms may occur suddenly or they may develop slowly.  DIAGNOSIS  Additional blood tests are often needed. These help your health care provider determine the best treatment. Your health care provider will check your stool for blood and look for other causes of blood loss.  TREATMENT  Treatment varies depending on the cause of the anemia. Treatment can include:   Supplements of iron, vitamin B12, or folic acid.   Hormone medicines.   A blood transfusion. This may be needed if blood loss is severe.   Hospitalization. This may be needed if there is significant continual blood loss.   Dietary changes.  Spleen removal. HOME CARE INSTRUCTIONS Keep all follow-up appointments. It often takes many weeks to correct anemia, and having your health care provider check on your condition and your response to treatment is very important. SEEK IMMEDIATE MEDICAL CARE IF:   You develop extreme weakness, shortness of breath, or chest pain.  You become dizzy or have trouble concentrating.  You develop heavy vaginal bleeding.   You develop a rash.   You have bloody or black, tarry stools.   You faint.   You vomit up blood.   You vomit repeatedly.   You have abdominal pain.  You have a fever or persistent symptoms for more than 2-3  days.   You have a fever and your symptoms suddenly get worse.   You are dehydrated.  MAKE SURE YOU:  Understand these instructions.  Will watch your condition.  Will get help right away if you are not doing well or get worse. Document Released: 02/12/2004 Document Revised: 09/06/2012 Document Reviewed: 06/30/2012 [facility] Patient Information 2015 [place], [place]. This information is not intended to replace advice given to you by your health care provider. Make sure you discuss any questions you have with your health care provider.   Resume taking iron supplements Take all your antibiotic medication.

## 2014-06-16 NOTE — ED Notes (Signed)
Patient states she has had dysuria and urinary frequency x 6 days. Patient denies any vaginal discharge.

## 2014-06-17 LAB — RPR: RPR Ser Ql: NONREACTIVE

## 2014-11-20 ENCOUNTER — Emergency Department (HOSPITAL_COMMUNITY): Payer: Medicaid Other

## 2014-11-20 ENCOUNTER — Emergency Department (HOSPITAL_COMMUNITY)
Admission: EM | Admit: 2014-11-20 | Discharge: 2014-11-20 | Disposition: A | Discharge status: Home / Self Care | Payer: Medicaid Other | Attending: Emergency Medicine | Admitting: Emergency Medicine

## 2014-11-20 ENCOUNTER — Encounter (HOSPITAL_COMMUNITY): Payer: Self-pay

## 2014-11-20 DIAGNOSIS — J45909 Unspecified asthma, uncomplicated: Secondary | ICD-10-CM | POA: Insufficient documentation

## 2014-11-20 DIAGNOSIS — Z8669 Personal history of other diseases of the nervous system and sense organs: Secondary | ICD-10-CM | POA: Diagnosis not present

## 2014-11-20 DIAGNOSIS — Z79899 Other long term (current) drug therapy: Secondary | ICD-10-CM | POA: Insufficient documentation

## 2014-11-20 DIAGNOSIS — M25561 Pain in right knee: Secondary | ICD-10-CM | POA: Diagnosis not present

## 2014-11-20 DIAGNOSIS — Z91040 Latex allergy status: Secondary | ICD-10-CM | POA: Insufficient documentation

## 2014-11-20 MED ORDER — IBUPROFEN 200 MG PO TABS
600.0000 mg | ORAL_TABLET | Freq: Once | ORAL | Status: AC
  Administered 2014-11-20: 600 mg via ORAL
  Filled 2014-11-20: qty 3

## 2014-11-20 NOTE — ED Provider Notes (Signed)
CSN: [id number]     Arrival date & time 11/20/14  1658 History  By signing my name below, I, Placido SouLogan Joldersma, attest that this documentation has been prepared under the direction and in the presence of Newell RubbermaidJeffrey Demetra Moya, PA-C. Electronically Signed: Placido SouLogan Joldersma, ED Scribe. 11/20/2014. 5:38 PM.   Chief Complaint  Patient presents with  . Knee Pain   The history is provided by the patient. No language interpreter was used.    HPI Comments: Shannon Huffman is a 29 y.o. female with a hx of morbid obesity, chronic right knee pain and a shx of right knee arthroscopy who presents to the Emergency Department complaining of constant, moderate, right knee pain with onset a few days ago. Pt notes that she was standing from a seated position and after taking two steps noticed a sudden onset of her pain that she describes as "sharp". She notes taking oxycodone for pain management which she says was provided by her PCP for her chronic pain issues further noting it provided no relief. She denies any pmhx including cardiac, abd or kidney issues. Pt denies any other associated symptoms at this time.   Past Medical History  Diagnosis Date  . Asthma   . Seizures (HCC)   . Epilepsy (HCC)   . Sleep apnea   . Morbid obesity [facility](HCC)    Past Surgical History  Procedure Laterality Date  . Right knee surgery Right Jan 2014  . Tonsillectomy and adenoidectomy  childhood   Family History  Problem Relation Age of Onset  . Diabetes Father   . Hypertension Mother    Social History  Substance Use Topics  . Smoking status: Never Smoker   . Smokeless tobacco: Never Used  . Alcohol Use: No   OB History    Gravida Para Term Preterm AB TAB SAB Ectopic Multiple Living   0 0 0 0 0 0 0 0 0 0      Review of Systems  All other systems reviewed and are negative.  Allergies  Adhesive and Latex  Home Medications   Prior to Admission medications   Medication Sig Start Date End Date Taking? Authorizing Provider   albuterol (PROVENTIL HFA;VENTOLIN HFA) 108 (90 BASE) MCG/ACT inhaler Inhale 2 puffs into the lungs every 6 (six) hours as needed. For shortness of breath   Yes Historical Provider, MD  HYDROcodone-acetaminophen (LORTAB) 10-325 MG tablet Take 0.5-1 tablets by mouth every 6 (six) hours as needed for severe pain.   Yes Historical Provider, MD  mometasone (NASONEX) 50 MCG/ACT nasal spray Place 2 sprays into the nose daily as needed (nasal congestion).    Yes Historical Provider, MD  phenazopyridine (PYRIDIUM) 200 MG tablet Take 1 tablet (200 mg total) by mouth 3 (three) times daily. Patient not taking: Reported on 11/20/2014 06/16/14   Danelle BerryLeisa Tapia, PA-C  Prenatal Multivit-Min-Fe-FA (PRENATAL VITAMINS) 0.8 MG tablet Take 1 tablet by mouth daily. Patient not taking: Reported on 06/16/2014 08/14/12   Minta BalsamMichael R Odom, MD   BP 129/76 mmHg  Pulse 87  Temp(Src) 98.1 F (36.7 C) (Oral)  Resp 17  SpO2 100%  LMP 10/19/2014 Physical Exam  Constitutional: She is oriented to person, place, and time. She appears well-developed and well-nourished.  HENT:  Head: Normocephalic and atraumatic.  Mouth/Throat: No oropharyngeal exudate.  Neck: Normal range of motion. No tracheal deviation present.  Cardiovascular: Normal rate.   Pulmonary/Chest: Effort normal. No respiratory distress.  Abdominal: Soft. There is no tenderness.  Neurological: She is alert and oriented  to person, place, and time.  Skin: Skin is warm and dry. She is not diaphoretic.  Psychiatric: She has a normal mood and affect. Her behavior is normal.  Nursing note and vitals reviewed.  ED Course  Procedures  DIAGNOSTIC STUDIES: Oxygen Saturation is 100% on RA, normal by my interpretation.    COORDINATION OF CARE: 5:35 PM Pt presents today due to right knee pain. Discussed treatment plan with pt at bedside including an x-ray of the affected knee and reevaluation based on results. Pt agreed to plan.  Labs Review Labs Reviewed - No data to  display  Imaging Review Dg Knee Complete 4 Views Right  11/20/2014  CLINICAL DATA:  Woke up with right knee pain, no injury. EXAM: RIGHT KNEE - COMPLETE 4+ VIEW COMPARISON:  10/14/2011. FINDINGS: No joint effusion or fracture. Minimal osteophytosis in the medial and lateral compartments. Minimal peaking of the tibial spines. IMPRESSION: 1. No acute findings. 2. Minimal degenerative change. Electronically Signed   By: Leanna Battles M.D.   On: 11/20/2014 17:59   I have personally reviewed and evaluated these images as part of my medical decision-making.   EKG Interpretation None     MDM   Final diagnoses:  Right knee pain   Labs: none indicated  Imaging: DG right knee  Consults: none  Therapeutics: 1x 600 mg ibuprofen  Assessment:  Plan: Patient presents with right knee pain, no acute findings on plain films, no laxity or positive special tests. Patient instructed to follow-up with orthopedist for further evaluation and management Pt given strict return precautions, verbalized understanding and agreement to today's plan and had no further questions or concerns at the time of discharge.   I personally performed the services described in this documentation, which was scribed in my presence. The recorded information has been reviewed and is accurate.    Eyvonne Mechanic, PA-C 11/21/14 2230  Raeford Razor, MD 11/22/14 1430

## 2014-11-20 NOTE — Discharge Instructions (Signed)
Joint Pain Joint pain, which is also called arthralgia, can be caused by many things. Joint pain often goes away when you follow your health care provider's instructions for relieving pain at home. However, joint pain can also be caused by conditions that require further treatment. Common causes of joint pain include:  Bruising in the area of the joint.  Overuse of the joint.  Wear and tear on the joints that occur with aging (osteoarthritis).  Various other forms of arthritis.  A buildup of a crystal form of uric acid in the joint (gout).  Infections of the joint (septic arthritis) or of the bone (osteomyelitis). Your health care provider may recommend medicine to help with the pain. If your joint pain continues, additional tests may be needed to diagnose your condition. HOME CARE INSTRUCTIONS Watch your condition for any changes. Follow these instructions as directed to lessen the pain that you are feeling.  Take medicines only as directed by your health care provider.  Rest the affected area for as long as your health care provider says that you should. If directed to do so, raise the painful joint above the level of your heart while you are sitting or lying down.  Do not do things that cause or worsen pain.  If directed, apply ice to the painful area:  Put ice in a plastic bag.  Place a towel between your skin and the bag.  Leave the ice on for 20 minutes, 2-3 times per day.  Wear an elastic bandage, splint, or sling as directed by your health care provider. Loosen the elastic bandage or splint if your fingers or toes become numb and tingle, or if they turn cold and blue.  Begin exercising or stretching the affected area as directed by your health care provider. Ask your health care provider what types of exercise are safe for you.  Keep all follow-up visits as directed by your health care provider. This is important. SEEK MEDICAL CARE IF:  Your pain increases, and medicine  does not help.  Your joint pain does not improve within 3 days.  You have increased bruising or swelling.  You have a fever.  You lose 10 lb (4.5 kg) or more without trying. SEEK IMMEDIATE MEDICAL CARE IF:  You are not able to move the joint.  Your fingers or toes become numb or they turn cold and blue.   This information is not intended to replace advice given to you by your health care provider. Make sure you discuss any questions you have with your health care provider.   Document Released: 01/04/2005 Document Revised: 01/25/2014 Document Reviewed: 10/16/2013 Elsevier Interactive Patient Education 2016 [employer]   Please follow up with PCP in 1 week if symptoms persist, follow up sooner it they worsen. Ice, ibuprofen, and rest.

## 2014-11-20 NOTE — ED Notes (Signed)
Pt c/o right knee pain radiating to hip. Pain started getting up from seated position. Hx of surgery on affected knee.

## 2015-06-17 LAB — CYTOLOGY - PAP: Pap: ABNORMAL — AB

## 2015-10-15 ENCOUNTER — Inpatient Hospital Stay (HOSPITAL_COMMUNITY)
Admission: AD | Admit: 2015-10-15 | Discharge: 2015-10-15 | Disposition: A | Discharge status: Home / Self Care | Payer: Medicaid Other | Source: Ambulatory Visit | Attending: Family Medicine | Admitting: Family Medicine

## 2015-10-15 ENCOUNTER — Encounter (HOSPITAL_COMMUNITY): Payer: Self-pay | Admitting: *Deleted

## 2015-10-15 DIAGNOSIS — N939 Abnormal uterine and vaginal bleeding, unspecified: Secondary | ICD-10-CM | POA: Diagnosis not present

## 2015-10-15 DIAGNOSIS — Z3202 Encounter for pregnancy test, result negative: Secondary | ICD-10-CM | POA: Diagnosis not present

## 2015-10-15 LAB — POCT PREGNANCY, URINE: PREG TEST UR: NEGATIVE

## 2015-10-15 LAB — CBC
HCT: 34.8 % — ABNORMAL LOW (ref 36.0–46.0)
HEMOGLOBIN: 11.5 g/dL — AB (ref 12.0–15.0)
MCH: 23.8 pg — AB (ref 26.0–34.0)
MCHC: 33 g/dL (ref 30.0–36.0)
MCV: 72 fL — ABNORMAL LOW (ref 78.0–100.0)
Platelets: 275 10*3/uL (ref 150–400)
RBC: 4.83 MIL/uL (ref 3.87–5.11)
RDW: 17.3 % — ABNORMAL HIGH (ref 11.5–15.5)
WBC: 6.9 10*3/uL (ref 4.0–10.5)

## 2015-10-15 MED ORDER — MEGESTROL ACETATE 40 MG PO TABS: 40.0000 mg | ORAL_TABLET | Freq: Three times a day (TID) | ORAL | 0 refills | Status: DC

## 2015-10-15 NOTE — MAU Note (Signed)
URINE IN LAB 

## 2015-10-15 NOTE — MAU Note (Addendum)
Bleeding since May. Sometimes heavy with clots and other times lighter. Cramping in abdomen and back. States this happened one time before when she was 30 years old. States she was given a shot, then put on BCP's. Saw her family doctor last week. States he advised she see a GYN. States she tried to make an appt, but was told there were not appts until October. States that office is near here, ? [place].

## 2015-10-15 NOTE — Discharge Instructions (Signed)

## 2015-10-15 NOTE — MAU Provider Note (Signed)
History     CSN: [id number]  Arrival date and time: 10/15/15 1600   First Provider Initiated Contact with Patient 10/15/15 1706       Chief Complaint  Patient presents with  . Vaginal Bleeding   HPI Shannon Huffman is a 30 y.o. G0P0000 female who presents for vaginal bleeding. Pt reports daily vaginal bleeding since May. Bleeding alternates from light menses to heavy bleeding with small clots. Has not saturated pads or tampons today. Also reports lower abdominal cramping that is intermittent. Rates pain 8/10. Treated with tylenol last night without relief. Nothing makes pain better or worse. States this bleeding occurred once before as a teenager and she was treated with OCPs. Has an appt with Dr. Clearance CootsHarper next month but when she called couldn't get in sooner with him.   OB History    Gravida Para Term Preterm AB Living   0 0 0 0 0 0   SAB TAB Ectopic Multiple Live Births   0 0 0 0        Past Medical History:  Diagnosis Date  . Asthma   . Epilepsy (HCC)   . Morbid obesity (HCC)   . Seizures (HCC)   . Sleep apnea     Past Surgical History:  Procedure Laterality Date  . right knee surgery Right Jan 2014  . TONSILLECTOMY AND ADENOIDECTOMY  childhood    Family History  Problem Relation Age of Onset  . Diabetes Father   . Hypertension Mother     Social History  Substance Use Topics  . Smoking status: Never Smoker  . Smokeless tobacco: Never Used  . Alcohol use No    Allergies:  Allergies  Allergen Reactions  . Adhesive [Tape] Rash  . Latex Rash    Prescriptions Prior to Admission  Medication Sig Dispense Refill Last Dose  . albuterol (PROVENTIL HFA;VENTOLIN HFA) 108 (90 BASE) MCG/ACT inhaler Inhale 2 puffs into the lungs every 6 (six) hours as needed for wheezing or shortness of breath.    10/14/2015 at Unknown time  . folic acid (FOLVITE) 1 MG tablet Take 1 mg by mouth daily.   Past Week at Unknown time  . oxyCODONE-acetaminophen (PERCOCET) 10-325 MG tablet  Take 1 tablet by mouth 3 (three) times daily.   10/15/2015 at 0600    Review of Systems  Constitutional: Negative.   Cardiovascular: Negative for chest pain and palpitations.  Gastrointestinal: Positive for abdominal pain.  Genitourinary:       + vaginal bleeding  Neurological: Negative for dizziness.   Physical Exam   Blood pressure 109/78, pulse 109, temperature 98.7 F (37.1 C), temperature source Oral, resp. rate 18, height 5\' 5"  (1.651 m), weight (!) 402 lb (182.3 kg), SpO2 99 %.  Physical Exam  Nursing note and vitals reviewed. Constitutional: She is oriented to person, place, and time. She appears well-developed and well-nourished. No distress.  Morbidly obese  HENT:  Head: Normocephalic and atraumatic.  Eyes: Conjunctivae are normal. Right eye exhibits no discharge. Left eye exhibits no discharge. No scleral icterus.  Neck: Normal range of motion.  Cardiovascular: Normal rate, regular rhythm and normal heart sounds.   No murmur heard. Respiratory: Effort normal and breath sounds normal. No respiratory distress. She has no wheezes.  GI: Soft.  Genitourinary: Cervix exhibits no motion tenderness and no friability.  Genitourinary Comments: Small amount of dark red blood. No active bleeding at this time. Uterus difficult to feel during bimanual d/t body habitus.   Neurological: She  is alert and oriented to person, place, and time.  Skin: Skin is warm and dry. She is not diaphoretic.  Psychiatric: She has a normal mood and affect. Her behavior is normal. Judgment and thought content normal.    MAU Course  Procedures Results for orders placed or performed during the hospital encounter of 10/15/15 (from the past 24 hour(s))  CBC     Status: Abnormal   Collection Time: 10/15/15  4:48 PM  Result Value Ref Range   WBC 6.9 4.0 - 10.5 K/uL   RBC 4.83 3.87 - 5.11 MIL/uL   Hemoglobin 11.5 (L) 12.0 - 15.0 g/dL   HCT 16.1 (L) 09.6 - 04.5 %   MCV 72.0 (L) 78.0 - 100.0 fL   MCH  23.8 (L) 26.0 - 34.0 pg   MCHC 33.0 30.0 - 36.0 g/dL   RDW 40.9 (H) 81.1 - 91.4 %   Platelets 275 150 - 400 K/uL  Pregnancy, urine POC     Status: None   Collection Time: 10/15/15  5:54 PM  Result Value Ref Range   Preg Test, Ur NEGATIVE NEGATIVE    MDM UPT negative CBC -- hemoglobin stable Orthostatic VS wnl  Assessment and Plan  A:  1. Abnormal uterine bleeding (AUB)     P: Discharge home Rx megace Keep f/u with Dr. Inge Rise 10/15/2015, 5:02 PM

## 2015-11-05 ENCOUNTER — Ambulatory Visit: Payer: Self-pay | Admitting: Obstetrics

## 2015-11-05 ENCOUNTER — Encounter: Payer: Self-pay | Admitting: Obstetrics and Gynecology

## 2015-11-05 ENCOUNTER — Ambulatory Visit (INDEPENDENT_AMBULATORY_CARE_PROVIDER_SITE_OTHER): Payer: Medicaid Other | Admitting: Obstetrics and Gynecology

## 2015-11-05 VITALS — BP 125/84 | HR 76 | Ht 65.0 in | Wt 395.0 lb

## 2015-11-05 DIAGNOSIS — N938 Other specified abnormal uterine and vaginal bleeding: Secondary | ICD-10-CM | POA: Insufficient documentation

## 2015-11-05 DIAGNOSIS — Z6841 Body Mass Index (BMI) 40.0 and over, adult: Secondary | ICD-10-CM | POA: Diagnosis not present

## 2015-11-05 MED ORDER — MEDROXYPROGESTERONE ACETATE 10 MG PO TABS: ORAL_TABLET | ORAL | 2 refills | Status: DC

## 2015-11-05 NOTE — Progress Notes (Signed)
Subjective:     Shannon Huffman is a 30 y.o. female here for follow up of her AUB. Pt was seen in MAU with vaginal bleeding for several months. Was placed on Megace and bleeding stopped. But returns when she stops taking. Pt reports a long standing H/O AUB. Has tried OCP's in the past which did not help. She is not sexual active. She had been working on wt loss with anticipation of having gastric by pass surgery. She lost @ 20-30 # but the VB has interfered with her work outs.  She had a yearly exam this past summer.  Review of Systems Pertinent items are noted in HPI.    Objective:   PE  AF VSS  Morbid obese female in NAD Lungs clear Heart RRR Abd soft obese  Assessment:    AUB  Suspect related to anovulatory cycles and pt's wt. She does not want to try OCP's or an IUD.  Will continue with Megace for the remainder of this month and then switch to cyclic Provera. She will start November first. Will check GYN U/S and TSH. Pt congratulated on her wt loss and encouraged to gained to work on wt loss. Recommend seeing general surgery for wt loss surgery.  Plan:   F/U in 2 months

## 2015-11-05 NOTE — Patient Instructions (Signed)

## 2015-11-06 LAB — TSH: TSH: 1.3 u[IU]/mL (ref 0.450–4.500)

## 2015-11-19 ENCOUNTER — Ambulatory Visit (HOSPITAL_COMMUNITY)
Admission: RE | Admit: 2015-11-19 | Discharge: 2015-11-19 | Disposition: A | Discharge status: Home / Self Care | Payer: Medicaid Other | Source: Ambulatory Visit | Attending: Obstetrics and Gynecology | Admitting: Obstetrics and Gynecology

## 2015-11-19 DIAGNOSIS — N938 Other specified abnormal uterine and vaginal bleeding: Secondary | ICD-10-CM | POA: Insufficient documentation

## 2015-11-19 DIAGNOSIS — Q505 Embryonic cyst of broad ligament: Secondary | ICD-10-CM | POA: Diagnosis not present

## 2015-11-19 NOTE — Addendum Note (Signed)
Addended by: Maretta BeesMCGLASHAN, Alantis Bethune J on: 11/19/2015 09:47 AM   Modules accepted: Orders

## 2015-12-09 ENCOUNTER — Other Ambulatory Visit: Payer: Self-pay | Admitting: Student

## 2016-01-03 ENCOUNTER — Encounter (HOSPITAL_COMMUNITY): Payer: Self-pay | Admitting: *Deleted

## 2016-01-03 ENCOUNTER — Inpatient Hospital Stay (HOSPITAL_COMMUNITY)
Admission: AD | Admit: 2016-01-03 | Discharge: 2016-01-04 | Disposition: A | Discharge status: Home / Self Care | Payer: Medicaid Other | Source: Ambulatory Visit | Attending: Obstetrics & Gynecology | Admitting: Obstetrics & Gynecology

## 2016-01-03 DIAGNOSIS — G4730 Sleep apnea, unspecified: Secondary | ICD-10-CM | POA: Diagnosis not present

## 2016-01-03 DIAGNOSIS — Z9889 Other specified postprocedural states: Secondary | ICD-10-CM | POA: Insufficient documentation

## 2016-01-03 DIAGNOSIS — G40909 Epilepsy, unspecified, not intractable, without status epilepticus: Secondary | ICD-10-CM | POA: Diagnosis not present

## 2016-01-03 DIAGNOSIS — R1030 Lower abdominal pain, unspecified: Secondary | ICD-10-CM | POA: Diagnosis not present

## 2016-01-03 DIAGNOSIS — R109 Unspecified abdominal pain: Secondary | ICD-10-CM

## 2016-01-03 DIAGNOSIS — J45909 Unspecified asthma, uncomplicated: Secondary | ICD-10-CM | POA: Insufficient documentation

## 2016-01-03 DIAGNOSIS — Z79899 Other long term (current) drug therapy: Secondary | ICD-10-CM | POA: Diagnosis not present

## 2016-01-03 DIAGNOSIS — Z833 Family history of diabetes mellitus: Secondary | ICD-10-CM | POA: Diagnosis not present

## 2016-01-03 DIAGNOSIS — N938 Other specified abnormal uterine and vaginal bleeding: Secondary | ICD-10-CM | POA: Diagnosis not present

## 2016-01-03 DIAGNOSIS — N939 Abnormal uterine and vaginal bleeding, unspecified: Secondary | ICD-10-CM | POA: Diagnosis not present

## 2016-01-03 DIAGNOSIS — Z8249 Family history of ischemic heart disease and other diseases of the circulatory system: Secondary | ICD-10-CM | POA: Diagnosis not present

## 2016-01-03 DIAGNOSIS — Z888 Allergy status to other drugs, medicaments and biological substances status: Secondary | ICD-10-CM | POA: Insufficient documentation

## 2016-01-03 LAB — CBC
HEMATOCRIT: 34.5 % — AB (ref 36.0–46.0)
Hemoglobin: 11.3 g/dL — ABNORMAL LOW (ref 12.0–15.0)
MCH: 22.4 pg — ABNORMAL LOW (ref 26.0–34.0)
MCHC: 32.8 g/dL (ref 30.0–36.0)
MCV: 68.5 fL — AB (ref 78.0–100.0)
PLATELETS: 293 10*3/uL (ref 150–400)
RBC: 5.04 MIL/uL (ref 3.87–5.11)
RDW: 17.2 % — ABNORMAL HIGH (ref 11.5–15.5)
WBC: 8.4 10*3/uL (ref 4.0–10.5)

## 2016-01-03 LAB — POCT PREGNANCY, URINE: Preg Test, Ur: NEGATIVE

## 2016-01-03 MED ORDER — KETOROLAC TROMETHAMINE 60 MG/2ML IM SOLN
60.0000 mg | Freq: Once | INTRAMUSCULAR | Status: AC
  Administered 2016-01-03: 60 mg via INTRAMUSCULAR
  Filled 2016-01-03: qty 2

## 2016-01-03 MED ORDER — MEGESTROL ACETATE 40 MG PO TABS: 40.0000 mg | ORAL_TABLET | Freq: Three times a day (TID) | ORAL | 0 refills | Status: DC

## 2016-01-03 MED ORDER — IBUPROFEN 800 MG PO TABS
800.0000 mg | ORAL_TABLET | Freq: Three times a day (TID) | ORAL | 0 refills | Status: DC
Start: 2016-01-03 — End: 2016-01-14

## 2016-01-03 NOTE — Discharge Instructions (Signed)
Abnormal Uterine Bleeding Abnormal uterine bleeding means bleeding from the vagina that is not your normal menstrual period. This can be:  Bleeding or spotting between periods.  Bleeding after sex (sexual intercourse).  Bleeding that is heavier or more than normal.  Periods that last longer than usual.  Bleeding after menopause. There are many problems that may cause this. Treatment will depend on the cause of the bleeding. Any kind of bleeding that is not normal should be reviewed by your doctor. Follow these instructions at home: Watch your condition for any changes. These actions may lessen any discomfort you are having:  Do not use tampons or douches as told by your doctor.  Change your pads often. You should get regular pelvic exams and Pap tests. Keep all appointments for tests as told by your doctor. Contact a doctor if:  You are bleeding for more than 1 week.  You feel dizzy at times. Get help right away if:  You pass out.  You have to change pads every 15 to 30 minutes.  You have belly pain.  You have a fever.  You become sweaty or weak.  You are passing large blood clots from the vagina.  You feel sick to your stomach (nauseous) and throw up (vomit). This information is not intended to replace advice given to you by your health care provider. Make sure you discuss any questions you have with your health care provider. Document Released: 11/01/2008 Document Revised: 06/12/2015 Document Reviewed: 08/03/2012 Elsevier Interactive Patient Education  2017 Elsevier Inc.  

## 2016-01-03 NOTE — MAU Provider Note (Signed)
History     CSN: [id number]  Arrival date and time: 01/03/16 2203   None     Chief Complaint  Patient presents with  . Abdominal Pain   HPI   [name] is a 30 y.o. female G0P0000 here in MAU with abnormal vaginal bleeding. This is an ongoing, chronic problem. She was seen by Dr. Alysia PennaErvin on 10/18 for this problem. She was started on cyclic provera. She started the provera on 12/1 and she started her menstrual cycle on 12/6. She continued to take the 10 days of provera. Her bleeding has been heavy today only. She has changed 10 pads today. She reports lower abdominal pain; the pain is described as cramping pain. She has not taken anything for the pain.   She has been on Megace in the past for her bleeding and feels this works better than anything to control her bleeding. She had a bottle of megace at home; 40 mg tabs. She took 3 of those today; one pill every 6 hours. She denied OCP's and IUD as a possible treatment option.   OB History    Gravida Para Term Preterm AB Living   0 0 0 0 0 0   SAB TAB Ectopic Multiple Live Births   0 0 0 0        Past Medical History:  Diagnosis Date  . Asthma   . Epilepsy (HCC)   . Morbid obesity (HCC)   . Seizures (HCC)   . Sleep apnea     Past Surgical History:  Procedure Laterality Date  . right knee surgery Right Jan 2014  . TONSILLECTOMY AND ADENOIDECTOMY  childhood    Family History  Problem Relation Age of Onset  . Diabetes Father   . Hypertension Mother     Social History  Substance Use Topics  . Smoking status: Never Smoker  . Smokeless tobacco: Never Used  . Alcohol use No    Allergies:  Allergies  Allergen Reactions  . Adhesive [Tape] Rash  . Clindamycin/Lincomycin Rash  . Latex     Prescriptions Prior to Admission  Medication Sig Dispense Refill Last Dose  . albuterol (PROVENTIL HFA;VENTOLIN HFA) 108 (90 BASE) MCG/ACT inhaler Inhale 2 puffs into the lungs every 6 (six) hours as needed for wheezing  or shortness of breath.    Taking  . cholecalciferol (VITAMIN D) 1000 units tablet Take 1,000 Units by mouth daily.   Taking  . folic acid (FOLVITE) 1 MG tablet Take 1 mg by mouth daily.   Taking  . medroxyPROGESTERone (PROVERA) 10 MG tablet Take first 10 days of each month 30 tablet 2   . megestrol (MEGACE) 40 MG tablet Take 1 tablet (40 mg total) by mouth 3 (three) times daily. 90 tablet 0 Taking  . oxyCODONE-acetaminophen (PERCOCET) 10-325 MG tablet Take 1 tablet by mouth 3 (three) times daily.   Taking   Results for orders placed or performed during the hospital encounter of 01/03/16 (from the past 48 hour(s))  Pregnancy, urine POC     Status: None   Collection Time: 01/03/16 10:54 PM  Result Value Ref Range   Preg Test, Ur NEGATIVE NEGATIVE    Comment:        THE SENSITIVITY OF THIS METHODOLOGY IS >24 mIU/mL   CBC     Status: Abnormal   Collection Time: 01/03/16 11:17 PM  Result Value Ref Range   WBC 8.4 4.0 - 10.5 K/uL   RBC 5.04 3.87 - 5.11 MIL/uL  Hemoglobin 11.3 (L) 12.0 - 15.0 g/dL   HCT 16.134.5 (L) 09.636.0 - 04.546.0 %   MCV 68.5 (L) 78.0 - 100.0 fL   MCH 22.4 (L) 26.0 - 34.0 pg   MCHC 32.8 30.0 - 36.0 g/dL   RDW 40.917.2 (H) 81.111.5 - 91.415.5 %   Platelets 293 150 - 400 K/uL    Review of Systems  Constitutional: Positive for malaise/fatigue.  Gastrointestinal: Positive for abdominal pain. Negative for nausea.  Neurological: Negative for dizziness.   Physical Exam   Blood pressure (!) 193/107, pulse 104, temperature 98.1 F (36.7 C), resp. rate 22, height 5\' 6"  (1.676 m), weight (!) 396 lb (179.6 kg).  Physical Exam  Constitutional: She is oriented to person, place, and time. She appears well-developed and well-nourished.  Obese female   Respiratory: Effort normal.  GI: Soft. She exhibits no distension. There is no tenderness. There is no rebound.  Genitourinary:  Genitourinary Comments: Vagina - Small amount of dark blood in the vaginal canal, no odor  Cervix - Small clot  noted at the os, no active bleeding Cervix closed Uterus non tender, normal size Adnexa non tender, no masses bilaterally, difficult exam due to body habitus  Chaperone present for exam.   Musculoskeletal: Normal range of motion.  Neurological: She is alert and oriented to person, place, and time.  Skin: Skin is warm.  Psychiatric: Her behavior is normal.   MAU Course  Procedures  None  MDM  CBC UPT- negative  Toradol 60 mg IM X1  Assessment and Plan   A:  1. Abnormal vaginal bleeding   2. Abdominal pain in female     P:  Discharge home in stable condition Rx: Ibuprofen Patient is scheduled in the [facility] on Tuesday 12/19-keep this appointment.  Continue Megace 40 mg TID Bleeding precautions Strict return precautions  Duane LopeJennifer I Rasch, NP 01/04/2016 7:08 AM     Duane LopeJennifer I Rasch, NP 01/03/2016 11:42 PM

## 2016-01-03 NOTE — MAU Note (Signed)
This pt is not pregnant. EFM strip from another pt.

## 2016-01-03 NOTE — MAU Note (Signed)
Started period 12/3. Continued bleeding since then and having 'blood clots' now that are white in color. Had problems with clots in Sept. Given Megace in Sept and helped.

## 2016-01-06 ENCOUNTER — Ambulatory Visit (INDEPENDENT_AMBULATORY_CARE_PROVIDER_SITE_OTHER): Payer: Medicaid Other | Admitting: Obstetrics and Gynecology

## 2016-01-06 ENCOUNTER — Encounter: Payer: Self-pay | Admitting: Obstetrics and Gynecology

## 2016-01-06 VITALS — BP 120/83 | HR 112 | Ht 65.0 in | Wt 399.0 lb

## 2016-01-06 DIAGNOSIS — Z6841 Body Mass Index (BMI) 40.0 and over, adult: Secondary | ICD-10-CM

## 2016-01-06 DIAGNOSIS — N938 Other specified abnormal uterine and vaginal bleeding: Secondary | ICD-10-CM | POA: Diagnosis not present

## 2016-01-06 NOTE — Progress Notes (Signed)
Pt presents for follow up medication to stop heavy Vb. Pt still c/o heavy bleeding with clots and changing an overnight pad every 10-15 mins while taking megestrol and Medroxyprogesterone. Pt also c/o low abdominal cramping 8/10. Pain relief with meds.

## 2016-01-06 NOTE — Progress Notes (Signed)
Pt here for follow up of her AUB. She has had problems with this for the last several months. At last visit bleeding had stopped and she was switched to cyclic Provera. However since taking Provera in December she has continued to bleeding since 12/24/15. She was seen in MAU with normal H& H. W/U has revealed as well a normal TSH and U/S with a slight thicken endometrium. She currently is taking Megace tid  PE AF VSS Lungs clear  Heart RRR Abd soft + BS obese  A/P AUB  Will increase Megace to 80 mg tid. [facility] Pt declines having performed today. Will check additional labs as well. Pt declines OCP's and IUD, considering Depo Provera Encouraged to continue to work on wt loss. Pt missed gastric by surgery eval appt. Encouraged to reschedule. F/U next week for EMBX.

## 2016-01-06 NOTE — Addendum Note (Signed)
Addended by: Hermina StaggersERVIN, Ariyanna Oien L on: 01/06/2016 12:03 PM   Modules accepted: Orders

## 2016-01-07 LAB — LUTEINIZING HORMONE: LH: 1 m[IU]/mL

## 2016-01-07 LAB — FOLLICLE STIMULATING HORMONE: FSH: 3.5 m[IU]/mL

## 2016-01-07 LAB — DHEA-SULFATE: DHEA-SO4: 42.8 ug/dL — ABNORMAL LOW (ref 84.8–378.0)

## 2016-01-07 LAB — PROLACTIN: Prolactin: 21.9 ng/mL (ref 4.8–23.3)

## 2016-01-08 LAB — TESTOSTERONE, FREE, TOTAL, SHBG
Sex Hormone Binding: 37.5 nmol/L (ref 24.6–122.0)
Testosterone, Free: <0.2 pg/mL (ref 0.0–4.2)

## 2016-01-14 ENCOUNTER — Encounter: Payer: Self-pay | Admitting: Obstetrics and Gynecology

## 2016-01-14 ENCOUNTER — Ambulatory Visit (INDEPENDENT_AMBULATORY_CARE_PROVIDER_SITE_OTHER): Payer: Medicaid Other | Admitting: Obstetrics and Gynecology

## 2016-01-14 ENCOUNTER — Encounter: Payer: Self-pay | Admitting: *Deleted

## 2016-01-14 VITALS — BP 130/88 | HR 93 | Ht 66.0 in | Wt >= 6400 oz

## 2016-01-14 DIAGNOSIS — N938 Other specified abnormal uterine and vaginal bleeding: Secondary | ICD-10-CM | POA: Diagnosis not present

## 2016-01-14 NOTE — Progress Notes (Signed)
ENDOMETRIAL BIOPSY     The indications for endometrial biopsy were reviewed.   Risks of the biopsy including cramping, bleeding, infection, uterine perforation, inadequate specimen and need for additional procedures  were discussed. The patient states she understands and agrees to undergo procedure today. Consent was signed. Time out was performed.  During the pelvic exam, the cervix was prepped with Betadine. A single-toothed tenaculum was placed on the anterior lip of the cervix to stabilize it. The 3 mm pipelle was introduced into the endometrial cavity without difficulty to a depth of 8 cm, and a moderate amount of tissue was obtained and sent to pathology. The instruments were removed from the patient's vagina. Minimal bleeding from the cervix was noted. The patient tolerated the procedure well. Routine post-procedure instructions were given to the patient.     

## 2016-01-21 ENCOUNTER — Telehealth: Payer: Self-pay | Admitting: *Deleted

## 2016-01-21 NOTE — Telephone Encounter (Signed)
Pt called office for missed call.  Pt made aware of normal u/s results, small cyst noted-no need for treatment.   Pt states that she has been taking Rx as given since last appt. Pt states she is still bleeding and would like to know if she can try Depo. Pt made aware message to be sent to Dr Alysia PennaErvin and await response.   Please advise on bleeding and possible depo Rx.

## 2016-01-27 ENCOUNTER — Other Ambulatory Visit: Payer: Self-pay | Admitting: *Deleted

## 2016-01-27 DIAGNOSIS — N938 Other specified abnormal uterine and vaginal bleeding: Secondary | ICD-10-CM

## 2016-01-27 MED ORDER — MEDROXYPROGESTERONE ACETATE 150 MG/ML IM SUSP: 150.0000 mg | INTRAMUSCULAR | 3 refills | Status: DC

## 2016-01-29 ENCOUNTER — Telehealth: Payer: Self-pay | Admitting: *Deleted

## 2016-01-29 NOTE — Telephone Encounter (Signed)
Pt has been given Rx for depo and has been advised about bleeding. Per Dr Alysia PennaErvin and J.Renae FicklePaul, Charity fundraiserN.

## 2016-01-30 ENCOUNTER — Ambulatory Visit (INDEPENDENT_AMBULATORY_CARE_PROVIDER_SITE_OTHER): Payer: Medicaid Other | Admitting: *Deleted

## 2016-01-30 VITALS — BP 136/80 | HR 99 | Wt 398.0 lb

## 2016-01-30 DIAGNOSIS — Z3042 Encounter for surveillance of injectable contraceptive: Secondary | ICD-10-CM | POA: Diagnosis not present

## 2016-01-30 DIAGNOSIS — N939 Abnormal uterine and vaginal bleeding, unspecified: Secondary | ICD-10-CM | POA: Diagnosis not present

## 2016-01-30 MED ORDER — MEDROXYPROGESTERONE ACETATE 150 MG/ML IM SUSP
150.0000 mg | Freq: Once | INTRAMUSCULAR | Status: AC
  Administered 2016-01-30: 150 mg via INTRAMUSCULAR

## 2016-04-22 ENCOUNTER — Ambulatory Visit (INDEPENDENT_AMBULATORY_CARE_PROVIDER_SITE_OTHER): Payer: Medicaid Other

## 2016-04-22 VITALS — BP 141/84 | HR 93 | Wt 394.9 lb

## 2016-04-22 DIAGNOSIS — Z3042 Encounter for surveillance of injectable contraceptive: Secondary | ICD-10-CM | POA: Diagnosis not present

## 2016-04-22 MED ORDER — MEDROXYPROGESTERONE ACETATE 150 MG/ML IM SUSP
150.0000 mg | INTRAMUSCULAR | Status: DC
  Administered 2016-04-22 – 2019-08-08 (×6): 150 mg via INTRAMUSCULAR

## 2016-04-22 NOTE — Progress Notes (Signed)
Patient is in the office for depo injection, administered and pt tolerated well, also advised pt to follow up with primary care provider for elevated BP, pt agreed. .. Administrations This Visit    medroxyPROGESTERone (DEPO-PROVERA) injection 150 mg    Admin Date 04/22/2016 Action Given Dose 150 mg Route Intramuscular Administered By Katrina Stack, RN

## 2016-06-18 DIAGNOSIS — I10 Essential (primary) hypertension: Secondary | ICD-10-CM

## 2016-06-18 HISTORY — DX: Essential (primary) hypertension: I10

## 2016-07-15 ENCOUNTER — Ambulatory Visit (INDEPENDENT_AMBULATORY_CARE_PROVIDER_SITE_OTHER): Payer: Medicaid Other

## 2016-07-15 VITALS — BP 143/91 | HR 84 | Wt >= 6400 oz

## 2016-07-15 DIAGNOSIS — Z3042 Encounter for surveillance of injectable contraceptive: Secondary | ICD-10-CM | POA: Diagnosis not present

## 2016-07-15 NOTE — Progress Notes (Signed)
Patient presents for DEPO. Given in    .  Tolerated well. Next DEPO 9/13-27/2018.  Administrations This Visit    medroxyPROGESTERone (DEPO-PROVERA) injection 150 mg    Admin Date 07/15/2016 Action Given Dose 150 mg Route Intramuscular Administered By Maretta BeesMcGlashan, Kendrew Paci J, RMA

## 2016-08-31 ENCOUNTER — Encounter: Payer: Self-pay | Admitting: Obstetrics

## 2016-08-31 ENCOUNTER — Ambulatory Visit (INDEPENDENT_AMBULATORY_CARE_PROVIDER_SITE_OTHER): Payer: Medicaid Other | Admitting: Obstetrics

## 2016-08-31 ENCOUNTER — Other Ambulatory Visit (HOSPITAL_COMMUNITY)
Admission: RE | Admit: 2016-08-31 | Discharge: 2016-08-31 | Disposition: A | Discharge status: Home / Self Care | Payer: Medicaid Other | Source: Ambulatory Visit | Attending: Obstetrics | Admitting: Obstetrics

## 2016-08-31 VITALS — BP 152/111 | HR 103 | Ht 66.0 in | Wt 389.0 lb

## 2016-08-31 DIAGNOSIS — N898 Other specified noninflammatory disorders of vagina: Secondary | ICD-10-CM | POA: Diagnosis present

## 2016-08-31 DIAGNOSIS — Z3042 Encounter for surveillance of injectable contraceptive: Secondary | ICD-10-CM

## 2016-08-31 DIAGNOSIS — N870 Mild cervical dysplasia: Secondary | ICD-10-CM | POA: Diagnosis not present

## 2016-08-31 DIAGNOSIS — Z6841 Body Mass Index (BMI) 40.0 and over, adult: Secondary | ICD-10-CM

## 2016-08-31 DIAGNOSIS — Z01419 Encounter for gynecological examination (general) (routine) without abnormal findings: Secondary | ICD-10-CM | POA: Insufficient documentation

## 2016-08-31 DIAGNOSIS — Z0000 Encounter for general adult medical examination without abnormal findings: Secondary | ICD-10-CM

## 2016-08-31 DIAGNOSIS — I10 Essential (primary) hypertension: Secondary | ICD-10-CM

## 2016-08-31 NOTE — Progress Notes (Signed)
Last pap 06/17/15 Abnormal Epithelial cell Abnormality

## 2016-08-31 NOTE — Progress Notes (Signed)
Subjective:        Shannon StabsShelby D Bernier is a 31 y.o. female here for a routine exam.  Current complaints: None.    Personal health questionnaire:  Is patient Ashkenazi Jewish, have a family history of breast and/or ovarian cancer: no Is there a family history of uterine cancer diagnosed at age < 1250, gastrointestinal cancer, urinary tract cancer, family member who is a Personnel officerLynch syndrome-associated carrier: no Is the patient overweight and hypertensive, family history of diabetes, personal history of gestational diabetes, preeclampsia or PCOS: no Is patient over 2955, have PCOS,  family history of premature CHD under age 30, diabetes, smoke, have hypertension or peripheral artery disease:  no At any time, has a partner hit, kicked or otherwise hurt or frightened you?: no Over the past 2 weeks, have you felt down, depressed or hopeless?: no Over the past 2 weeks, have you felt little interest or pleasure in doing things?:no   Gynecologic History No LMP recorded. Patient has had an injection. Contraception: Depo-Provera injections Last Pap: 2017. Results were: ASCUS with positive High Risk HPV Last mammogram: n/a. Results were: n/a  Obstetric History OB History  Gravida Para Term Preterm AB Living  0 0 0 0 0 0  SAB TAB Ectopic Multiple Live Births  0 0 0 0          Past Medical History:  Diagnosis Date  . Asthma   . Epilepsy (HCC)   . Hypertension 06/2016  . Morbid obesity (HCC)   . Seizures (HCC)   . Sleep apnea     Past Surgical History:  Procedure Laterality Date  . right knee surgery Right Jan 2014  . TONSILLECTOMY AND ADENOIDECTOMY  childhood     Current Outpatient Prescriptions:  .  albuterol (PROVENTIL HFA;VENTOLIN HFA) 108 (90 BASE) MCG/ACT inhaler, Inhale 2 puffs into the lungs every 6 (six) hours as needed for wheezing or shortness of breath. , Disp: , Rfl:  .  atenolol (TENORMIN) 50 MG tablet, Take 50 mg by mouth daily., Disp: , Rfl:  .  cholecalciferol (VITAMIN  D) 1000 units tablet, Take 1,000 Units by mouth daily., Disp: , Rfl:  .  hydrochlorothiazide (HYDRODIURIL) 25 MG tablet, Take 25 mg by mouth daily., Disp: , Rfl:  .  medroxyPROGESTERone (DEPO-PROVERA) 150 MG/ML injection, Inject 1 mL (150 mg total) into the muscle every 3 (three) months., Disp: 1 mL, Rfl: 3 .  oxyCODONE-acetaminophen (PERCOCET) 10-325 MG tablet, Take 1 tablet by mouth 3 (three) times daily., Disp: , Rfl:  .  folic acid (FOLVITE) 1 MG tablet, Take 1 mg by mouth daily., Disp: , Rfl:  .  traMADol (ULTRAM) 50 MG tablet, Take by mouth every 6 (six) hours as needed., Disp: , Rfl:   Current Facility-Administered Medications:  .  medroxyPROGESTERone (DEPO-PROVERA) injection 150 mg, 150 mg, Intramuscular, Q90 days, Brock BadHarper, Auna Mikkelsen A, MD, 150 mg at 07/15/16 1014 Allergies  Allergen Reactions  . Adhesive [Tape] Rash  . Clindamycin/Lincomycin Rash  . Latex     Social History  Substance Use Topics  . Smoking status: Never Smoker  . Smokeless tobacco: Never Used  . Alcohol use No     Comment: social    Family History  Problem Relation Age of Onset  . Diabetes Father   . Hypertension Father   . Hypertension Mother   . Hypertension Maternal Grandmother   . Diabetes Maternal Grandmother   . Hypertension Maternal Grandfather   . Diabetes Maternal Grandfather   . Hypertension Paternal  Grandmother   . Diabetes Paternal Grandmother   . Hypertension Paternal Grandfather   . Diabetes Paternal Grandfather       Review of Systems  Constitutional: negative for fatigue and weight loss Respiratory: negative for cough and wheezing Cardiovascular: negative for chest pain, fatigue and palpitations Gastrointestinal: negative for abdominal pain and change in bowel habits Musculoskeletal:negative for myalgias Neurological: negative for gait problems and tremors Behavioral/Psych: negative for abusive relationship, depression Endocrine: negative for temperature intolerance     Genitourinary:negative for abnormal menstrual periods, genital lesions, hot flashes, sexual problems and vaginal discharge Integument/breast: negative for breast lump, breast tenderness, nipple discharge and skin lesion(s)    Objective:       BP (!) 152/111   Pulse (!) 103   Ht 5\' 6"  (1.676 m)   Wt (!) 389 lb (176.4 kg)   BMI 62.79 kg/m  General:   alert  Skin:   no rash or abnormalities  Lungs:   clear to auscultation bilaterally  Heart:   regular rate and rhythm, S1, S2 normal, no murmur, click, rub or gallop  Breasts:   normal without suspicious masses, skin or nipple changes or axillary nodes  Abdomen:  normal findings: no organomegaly, soft, non-tender and no hernia  Pelvis:  External genitalia: normal general appearance Urinary system: urethral meatus normal and bladder without fullness, nontender Vaginal: normal without tenderness, induration or masses Cervix: normal appearance Adnexa: normal bimanual exam Uterus: anteverted and non-tender, normal size   Lab Review Urine pregnancy test Labs reviewed yes Radiologic studies reviewed no  50% of 20 min visit spent on counseling and coordination of care.    Assessment:     1. Encounter for gynecological examination with Papanicolaou smear of cervix Rx: - Cytology - PAP  2. Vaginal discharge Rx: - Cervicovaginal ancillary only  3. Encounter for surveillance of injectable contraceptive - pleased with Depo Provera  4. Morbid obesity with BMI of 50.0-59.9, adult (HCC) - weight loss program including diet and exercise recommended - she likes Depo but discussed the side effect of weight gain with Depo - Bariatrics is a consideration  5. HTN (hypertension), benign - taking Atenolol and HCTZ - BP managed by PCP   Plan:    Education reviewed: calcium supplements, depression evaluation, low fat, low cholesterol diet, safe sex/STD prevention, self breast exams and weight bearing exercise. Contraception: Depo-Provera  injections. Follow up in: 1 year.   Meds ordered this encounter  Medications  . atenolol (TENORMIN) 50 MG tablet    Sig: Take 50 mg by mouth daily.  . hydrochlorothiazide (HYDRODIURIL) 25 MG tablet    Sig: Take 25 mg by mouth daily.   No orders of the defined types were placed in this encounter.    Patient ID: Shannon Huffman, female   DOB: [birth year], 31 y.o.   MRN: [id number]

## 2016-09-01 LAB — CERVICOVAGINAL ANCILLARY ONLY
BACTERIAL VAGINITIS: NEGATIVE
CANDIDA VAGINITIS: NEGATIVE

## 2016-09-02 ENCOUNTER — Other Ambulatory Visit: Payer: Self-pay | Admitting: Obstetrics

## 2016-09-02 LAB — CYTOLOGY - PAP
HPV (WINDOPATH): DETECTED — AB
HPV 16/18/45 genotyping: NEGATIVE

## 2016-09-30 ENCOUNTER — Ambulatory Visit: Payer: Medicaid Other

## 2016-10-11 ENCOUNTER — Ambulatory Visit (INDEPENDENT_AMBULATORY_CARE_PROVIDER_SITE_OTHER): Payer: Medicaid Other | Admitting: Obstetrics and Gynecology

## 2016-10-11 ENCOUNTER — Other Ambulatory Visit (HOSPITAL_COMMUNITY)
Admission: RE | Admit: 2016-10-11 | Discharge: 2016-10-11 | Disposition: A | Discharge status: Home / Self Care | Payer: Medicaid Other | Source: Ambulatory Visit | Attending: Obstetrics and Gynecology | Admitting: Obstetrics and Gynecology

## 2016-10-11 DIAGNOSIS — N870 Mild cervical dysplasia: Secondary | ICD-10-CM | POA: Diagnosis not present

## 2016-10-11 DIAGNOSIS — Z3042 Encounter for surveillance of injectable contraceptive: Secondary | ICD-10-CM | POA: Diagnosis not present

## 2016-10-11 DIAGNOSIS — R87612 Low grade squamous intraepithelial lesion on cytologic smear of cervix (LGSIL): Secondary | ICD-10-CM | POA: Insufficient documentation

## 2016-10-11 NOTE — Progress Notes (Signed)
Pt was given Depo injection at today's visit. Pt is on time for injection. Pt to RTO on 01/02/17 for next depo.  Administrations This Visit    medroxyPROGESTERone (DEPO-PROVERA) injection 150 mg    Admin Date 10/11/2016 Action Given Dose 150 mg Route Intramuscular Administered By Lanney Gins, CMA

## 2016-10-11 NOTE — Progress Notes (Signed)
Patient ID: Shannon Huffman, female   DOB: 01-26-1985, 31 y.o.   MRN: [id number]  Chief Complaint  Patient presents with  . Colposcopy    abnormal pap smear, depo inj due today    HPI Shannon Huffman is a 31 y.o. female.   HPI  Indications: Pap smear on August 2018 showed: low-grade squamous intraepithelial neoplasia (LGSIL - encompassing HPV,mild dysplasia,CIN I). UPT today negative. Depo Provera for contraception  Past Medical History:  Diagnosis Date  . Asthma   . Epilepsy (HCC)   . Hypertension 06/2016  . Morbid obesity (HCC)   . Seizures (HCC)   . Sleep apnea     Past Surgical History:  Procedure Laterality Date  . right knee surgery Right Jan 2014  . TONSILLECTOMY AND ADENOIDECTOMY  childhood    Family History  Problem Relation Age of Onset  . Diabetes Father   . Hypertension Father   . Hypertension Mother   . Hypertension Maternal Grandmother   . Diabetes Maternal Grandmother   . Hypertension Maternal Grandfather   . Diabetes Maternal Grandfather   . Hypertension Paternal Grandmother   . Diabetes Paternal Grandmother   . Hypertension Paternal Grandfather   . Diabetes Paternal Grandfather     Social History Social History  Substance Use Topics  . Smoking status: Never Smoker  . Smokeless tobacco: Never Used  . Alcohol use No     Comment: social    Allergies  Allergen Reactions  . Adhesive [Tape] Rash  . Clindamycin/Lincomycin Rash  . Latex     Current Outpatient Prescriptions  Medication Sig Dispense Refill  . albuterol (PROVENTIL HFA;VENTOLIN HFA) 108 (90 BASE) MCG/ACT inhaler Inhale 2 puffs into the lungs every 6 (six) hours as needed for wheezing or shortness of breath.     Marland Kitchen atenolol (TENORMIN) 50 MG tablet Take 50 mg by mouth daily.    . cholecalciferol (VITAMIN D) 1000 units tablet Take 1,000 Units by mouth daily.    . folic acid (FOLVITE) 1 MG tablet Take 1 mg by mouth daily.    . hydrochlorothiazide (HYDRODIURIL) 25 MG tablet Take 25  mg by mouth daily.    . medroxyPROGESTERone (DEPO-PROVERA) 150 MG/ML injection Inject 1 mL (150 mg total) into the muscle every 3 (three) months. 1 mL 3  . oxyCODONE-acetaminophen (PERCOCET) 10-325 MG tablet Take 1 tablet by mouth 3 (three) times daily.    . traMADol (ULTRAM) 50 MG tablet Take by mouth every 6 (six) hours as needed.     Current Facility-Administered Medications  Medication Dose Route Frequency Provider Last Rate Last Dose  . medroxyPROGESTERone (DEPO-PROVERA) injection 150 mg  150 mg Intramuscular Q90 days Brock Bad, MD   150 mg at 07/15/16 1014    Review of Systems Review of Systems  Blood pressure (!) 121/91, pulse 84.  Physical Exam Physical Exam  Data Reviewed   Assessment    Procedure Details  The risks and benefits of the procedure and Written informed consent obtained.  Speculum placed in vagina and excellent visualization of cervix achieved, cervix swabbed x 3 with acetic acid solution. HPV changes were noted. ECC and Cervical Bx were taken at 12 & 6 o'clock.  Monsel's applied. Pt tolerated wel  Complications: none.     Plan    Specimens labelled and sent to Pathology. F/U per Bx results      Shannon Huffman 10/11/2016, 3:09 PM

## 2016-10-11 NOTE — Patient Instructions (Signed)

## 2016-10-14 ENCOUNTER — Telehealth: Payer: Self-pay

## 2016-10-14 NOTE — Telephone Encounter (Signed)
-----   Message from Michael L Ervin, MD sent at 10/14/2016  8:18 AM EDT ----- Cervical Bx show CIN 1 Recommend cryo to cervix Thanks Michael 

## 2016-10-14 NOTE — Telephone Encounter (Signed)
Left VM to call office.

## 2016-10-15 ENCOUNTER — Telehealth: Payer: Self-pay

## 2016-10-15 NOTE — Telephone Encounter (Signed)
-----   Message from Hermina Staggers, MD sent at 10/14/2016  8:18 AM EDT ----- Cervical Bx show CIN 1 Recommend cryo to cervix Thanks Casimiro Needle

## 2016-10-15 NOTE — Telephone Encounter (Signed)
Patient notified of results and need for Cryo. Transfer to front office to schedule appt.

## 2016-10-20 ENCOUNTER — Encounter: Payer: Self-pay | Admitting: *Deleted

## 2016-11-10 ENCOUNTER — Ambulatory Visit: Payer: Medicaid Other | Admitting: Obstetrics

## 2016-11-17 ENCOUNTER — Ambulatory Visit: Payer: Medicaid Other | Admitting: Obstetrics

## 2016-11-30 ENCOUNTER — Encounter: Payer: Self-pay | Admitting: Obstetrics and Gynecology

## 2016-11-30 ENCOUNTER — Ambulatory Visit: Payer: Medicaid Other | Admitting: Obstetrics and Gynecology

## 2016-11-30 ENCOUNTER — Encounter: Payer: Self-pay | Admitting: *Deleted

## 2016-11-30 VITALS — BP 129/87 | HR 96 | Wt 392.3 lb

## 2016-11-30 DIAGNOSIS — N870 Mild cervical dysplasia: Secondary | ICD-10-CM | POA: Diagnosis not present

## 2016-11-30 NOTE — Progress Notes (Signed)
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 She was counseled about that efficacy of this procedure, and possible need for excisional procedure in the future if her cervical dysplasia persists.  The risks of the procedure where explained in detail and patient was told to expect a copious amount of discharge in the next few weeks. All her questions were answered, and written informed consent was obtained.  The patient was placed in the dorsal lithotomy position and a vaginal speculum was placed. Her cervix was visualized and patient was noted to have had normal size transformation zone. The appropriate cryotherapy probe was picked and affixed to cryotherapy apparatus. Then nitrogen gas was then activated, the probe was coated with lubricating jelly and applied to the transformation zone of the cervix. This was kept in place for 3 minutes. The cryotherapy was then stopped and all instruments were removed from the patient's pelvis; a thawing period of 3 minutes was observed.  A second cycle of cryotherapy was then administered to the cervix for 3 minutes.  The patient tolerated the procedure well without any complications. Routine post procedure instructions were given to the patient.  F/U appt in 4 weeks

## 2016-11-30 NOTE — Patient Instructions (Signed)
Cryoablation, Care After °This sheet gives you information about how to care for yourself after your procedure. Your health care provider may also give you more specific instructions. If you have problems or questions, contact your health care provider. °What can I expect after the procedure? °After the procedure, it is common to have: °· Soreness around the treatment area. °· Mild pain and swelling in the treatment area. ° °Follow these instructions at home: °Treatment area care ° °· Follow instructions from your health care provider about how to take care of your incision. Make sure you: °? Wash your hands with soap and water before you change your bandage (dressing). If soap and water are not available, use hand sanitizer. °? Change your dressing as told by your health care provider. °? Leave stitches (sutures) in place. They may need to stay in place for 2 weeks or longer. °· Check your treatment area every day for signs of infection. Check for: °? More redness, swelling, or pain. °? More fluid or blood. °? Warmth. °? Pus or a bad smell. °· Keep the treated area clean, dry, and covered with a dressing until it has healed. Clean the area with soap and water or as told by your health care provider. °· You may shower if your health care provider approves. If your bandage gets wet, change it right away. °Activity °· Follow instructions from your health care provider about any activity limitations. °· Do not drive for 24 hours if you received a medicine to help you relax (sedative). °General instructions °· Take over-the-counter and prescription medicines only as told by your health care provider. °· Keep all follow-up visits as told by your health care provider. This is important. °Contact a health care provider if: °· You do not have a bowel movement for 2 days. °· You have nausea or vomiting. °· You have more redness, swelling, or pain around your treatment area. °· You have more fluid or blood coming from your  treatment area. °· Your treatment area feels warm to the touch. °· You have pus or a bad smell coming from your treatment area. °· You have a fever. °Get help right away if: °· You have severe pain. °· You have trouble swallowing or breathing. °· You have severe weakness or dizziness. °· You have chest pain or shortness of breath. °This information is not intended to replace advice given to you by your health care provider. Make sure you discuss any questions you have with your health care provider. °Document Released: 10/25/2012 Document Revised: 07/25/2015 Document Reviewed: 06/04/2015 °Elsevier Interactive Patient Education © 2018 [employer] ° °

## 2016-12-13 ENCOUNTER — Telehealth: Payer: Self-pay

## 2016-12-13 NOTE — Telephone Encounter (Signed)
Patient called concerned about the discharge that she was having after CRYO, advised that it is normal to have a discharge for the next 4-6 weeks with odor.

## 2017-01-03 ENCOUNTER — Ambulatory Visit: Payer: Medicaid Other | Admitting: Obstetrics and Gynecology

## 2017-01-03 ENCOUNTER — Ambulatory Visit: Payer: Medicaid Other

## 2017-01-03 ENCOUNTER — Encounter: Payer: Self-pay | Admitting: Obstetrics and Gynecology

## 2017-01-03 VITALS — BP 152/87 | HR 90 | Wt 397.0 lb

## 2017-01-03 DIAGNOSIS — Z3042 Encounter for surveillance of injectable contraceptive: Secondary | ICD-10-CM | POA: Diagnosis not present

## 2017-01-03 DIAGNOSIS — N898 Other specified noninflammatory disorders of vagina: Secondary | ICD-10-CM

## 2017-01-03 DIAGNOSIS — Z09 Encounter for follow-up examination after completed treatment for conditions other than malignant neoplasm: Secondary | ICD-10-CM

## 2017-01-03 MED ORDER — MEDROXYPROGESTERONE ACETATE 150 MG/ML IM SUSP
150.0000 mg | Freq: Once | INTRAMUSCULAR | Status: AC
  Administered 2017-01-03: 150 mg via INTRAMUSCULAR

## 2017-01-03 NOTE — Progress Notes (Signed)
31 yo G0 with BMI 64  here for follow up since cryotherapy on 11/13. Patient reports excessive vaginal discharge initially which has resolved. Patient is without complaints and is due for depo-provera today  Past Medical History:  Diagnosis Date  . Asthma   . Epilepsy ([place])   . Hypertension 06/2016  . Morbid obesity ([place])   . Seizures ([place])   . Sleep apnea    Past Surgical History:  Procedure Laterality Date  . right knee surgery Right Jan 2014  . TONSILLECTOMY AND ADENOIDECTOMY  childhood   Family History  Problem Relation Age of Onset  . Diabetes Father   . Hypertension Father   . Hypertension Mother   . Hypertension Maternal Grandmother   . Diabetes Maternal Grandmother   . Hypertension Maternal Grandfather   . Diabetes Maternal Grandfather   . Hypertension Paternal Grandmother   . Diabetes Paternal Grandmother   . Hypertension Paternal Grandfather   . Diabetes Paternal Grandfather    Social History   Tobacco Use  . Smoking status: Never Smoker  . Smokeless tobacco: Never Used  Substance Use Topics  . Alcohol use: No    Comment: social  . Drug use: No   ROS See pertinent in HPI  Blood pressure (!) 152/87, pulse 90, weight (!) 397 lb (180.1 kg). GENERAL: Well-developed, well-nourished female in no acute distress. Obese PELVIC: Normal external female genitalia. Vagina is pink and rugated.  Normal discharge. Normal appearing cervix.  EXTREMITIES: No cyanosis, clubbing, or edema, 2+ distal pulses.  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 She has met with nutritionist already. She plans to start by modifying diet and increase physical activity level - RTC prn

## 2017-01-03 NOTE — Progress Notes (Signed)
Pt given depo today. Last depo was given 10/11/16. Inj given in left upper outer quad. Pt tolerated well. Next depo due 3/4-3/18.

## 2017-03-28 ENCOUNTER — Ambulatory Visit (INDEPENDENT_AMBULATORY_CARE_PROVIDER_SITE_OTHER): Payer: Medicaid Other

## 2017-03-28 ENCOUNTER — Other Ambulatory Visit: Payer: Self-pay | Admitting: Obstetrics and Gynecology

## 2017-03-28 DIAGNOSIS — N938 Other specified abnormal uterine and vaginal bleeding: Secondary | ICD-10-CM

## 2017-03-28 DIAGNOSIS — Z3042 Encounter for surveillance of injectable contraceptive: Secondary | ICD-10-CM | POA: Diagnosis not present

## 2017-03-28 MED ORDER — MEDROXYPROGESTERONE ACETATE 150 MG/ML IM SUSP
150.0000 mg | Freq: Once | INTRAMUSCULAR | Status: AC
  Administered 2017-03-28: 150 mg via INTRAMUSCULAR

## 2017-03-28 NOTE — Progress Notes (Signed)
I have reviewed the chart and agree with nursing staff's documentation of this patient's encounter.  Shannon Dacus, MD 03/28/2017 5:11 PM    

## 2017-03-28 NOTE — Progress Notes (Signed)
Pt here for depo shot. Inj given in right upper outer quad. Pt tolerated well. Next Depo due 5/27-6/10. Pt supplied.

## 2017-06-21 ENCOUNTER — Ambulatory Visit (INDEPENDENT_AMBULATORY_CARE_PROVIDER_SITE_OTHER): Payer: Medicaid Other

## 2017-06-21 DIAGNOSIS — Z3042 Encounter for surveillance of injectable contraceptive: Secondary | ICD-10-CM | POA: Diagnosis not present

## 2017-06-21 MED ORDER — MEDROXYPROGESTERONE ACETATE 150 MG/ML IM SUSP
150.0000 mg | Freq: Once | INTRAMUSCULAR | Status: AC
  Administered 2017-06-21: 150 mg via INTRAMUSCULAR

## 2017-06-21 NOTE — Progress Notes (Signed)
Pt here for depo shot. Pt is within her window. Inj given RUOQ. Pt tolerated well. Next depo due 8/20-9/3 

## 2017-06-21 NOTE — Progress Notes (Signed)
I have reviewed this chart and agree with the RN/CMA assessment and management.    K. Meryl Rashawn Rolon, M.D. Attending Obstetrician & Gynecologist, [facility], [facility]  

## 2017-09-12 ENCOUNTER — Ambulatory Visit (INDEPENDENT_AMBULATORY_CARE_PROVIDER_SITE_OTHER): Payer: Medicaid Other

## 2017-09-12 DIAGNOSIS — Z3042 Encounter for surveillance of injectable contraceptive: Secondary | ICD-10-CM | POA: Diagnosis not present

## 2017-09-12 MED ORDER — MEDROXYPROGESTERONE ACETATE 150 MG/ML IM SUSP
150.0000 mg | Freq: Once | INTRAMUSCULAR | Status: AC
  Administered 2017-09-12: 150 mg via INTRAMUSCULAR

## 2017-09-12 NOTE — Progress Notes (Signed)
I have reviewed this chart and agree with the RN/CMA assessment and management.    K. Meryl Davis, M.D. [facility]  

## 2017-09-12 NOTE — Progress Notes (Addendum)
Nurse visit for Depo. Pt is within Depo window. Depo given RUOQ w/o difficulty. Next Depo due Nov 11-25, pt agrees. Annual due, pt will schedule visit during checkout.

## 2017-11-16 ENCOUNTER — Other Ambulatory Visit (HOSPITAL_COMMUNITY)
Admission: RE | Admit: 2017-11-16 | Discharge: 2017-11-16 | Disposition: A | Discharge status: Home / Self Care | Payer: Medicaid Other | Source: Ambulatory Visit | Attending: Obstetrics | Admitting: Obstetrics

## 2017-11-16 ENCOUNTER — Encounter: Payer: Self-pay | Admitting: Obstetrics

## 2017-11-16 ENCOUNTER — Ambulatory Visit: Payer: Medicaid Other | Admitting: Obstetrics

## 2017-11-16 VITALS — BP 130/75 | HR 86 | Ht 66.0 in | Wt >= 6400 oz

## 2017-11-16 DIAGNOSIS — A5901 Trichomonal vulvovaginitis: Secondary | ICD-10-CM | POA: Diagnosis not present

## 2017-11-16 DIAGNOSIS — Z01411 Encounter for gynecological examination (general) (routine) with abnormal findings: Secondary | ICD-10-CM | POA: Insufficient documentation

## 2017-11-16 DIAGNOSIS — R87810 Cervical high risk human papillomavirus (HPV) DNA test positive: Secondary | ICD-10-CM | POA: Insufficient documentation

## 2017-11-16 DIAGNOSIS — Z3042 Encounter for surveillance of injectable contraceptive: Secondary | ICD-10-CM

## 2017-11-16 DIAGNOSIS — Z01419 Encounter for gynecological examination (general) (routine) without abnormal findings: Secondary | ICD-10-CM

## 2017-11-16 DIAGNOSIS — I10 Essential (primary) hypertension: Secondary | ICD-10-CM

## 2017-11-16 DIAGNOSIS — N898 Other specified noninflammatory disorders of vagina: Secondary | ICD-10-CM

## 2017-11-16 DIAGNOSIS — R87610 Atypical squamous cells of undetermined significance on cytologic smear of cervix (ASC-US): Secondary | ICD-10-CM | POA: Diagnosis not present

## 2017-11-16 DIAGNOSIS — Z6841 Body Mass Index (BMI) 40.0 and over, adult: Secondary | ICD-10-CM

## 2017-11-16 DIAGNOSIS — Z0000 Encounter for general adult medical examination without abnormal findings: Secondary | ICD-10-CM

## 2017-11-16 DIAGNOSIS — N870 Mild cervical dysplasia: Secondary | ICD-10-CM

## 2017-11-16 NOTE — Progress Notes (Signed)
Patient is in the office for annual, last pap 08-31-16. Pt declines std testing, but wants to be tested for yeast and bv. Pt is currently on depo.

## 2017-11-16 NOTE — Progress Notes (Signed)
Subjective:        Shannon Huffman is a 32 y.o. female here for a routine exam.  Current complaints: None.    Personal health questionnaire:  Is patient Ashkenazi Jewish, have a family history of breast and/or ovarian cancer: no Is there a family history of uterine cancer diagnosed at age < 58, gastrointestinal cancer, urinary tract cancer, family member who is a Personnel officer syndrome-associated carrier: no Is the patient overweight and hypertensive, family history of diabetes, personal history of gestational diabetes, preeclampsia or PCOS: no Is patient over 40, have PCOS,  family history of premature CHD under age 6, diabetes, smoke, have hypertension or peripheral artery disease:  no At any time, has a partner hit, kicked or otherwise hurt or frightened you?: no Over the past 2 weeks, have you felt down, depressed or hopeless?: no Over the past 2 weeks, have you felt little interest or pleasure in doing things?:no   Gynecologic History No LMP recorded. Patient has had an injection. Contraception: Depo-Provera injections Last Pap: 2018. Results were: LGSIL.    Colposcopy Results:  LGSIL with negative ECC Last mammogram: N/A. Results were: N/A  Obstetric History OB History  Gravida Para Term Preterm AB Living  0 0 0 0 0 0  SAB TAB Ectopic Multiple Live Births  0 0 0 0      Past Medical History:  Diagnosis Date  . Asthma   . Epilepsy ([facility])   . Hypertension 06/2016  . Morbid obesity ([facility])   . Seizures ([facility])   . Sleep apnea     Past Surgical History:  Procedure Laterality Date  . right knee surgery Right Jan 2014  . TONSILLECTOMY AND ADENOIDECTOMY  childhood     Current Outpatient Medications:  .  albuterol (PROVENTIL HFA;VENTOLIN HFA) 108 (90 BASE) MCG/ACT inhaler, Inhale 2 puffs into the lungs every 6 (six) hours as needed for wheezing or shortness of breath. , Disp: , Rfl:  .  atenolol (TENORMIN) 50 MG tablet, Take 50 mg by mouth daily., Disp: , Rfl:  .   cholecalciferol (VITAMIN D) 1000 units tablet, Take 1,000 Units by mouth daily., Disp: , Rfl:  .  hydrochlorothiazide (HYDRODIURIL) 25 MG tablet, Take 25 mg by mouth daily., Disp: , Rfl:  .  medroxyPROGESTERone (DEPO-PROVERA) 150 MG/ML injection, INJECT 1 ML INTO MUSCLE ONCE EVERY 3 MONTHS, Disp: 1 mL, Rfl: 3 .  mometasone (NASONEX) 50 MCG/ACT nasal spray, Place into the nose., Disp: , Rfl:  .  oxyCODONE-acetaminophen (PERCOCET) 10-325 MG tablet, Take 1 tablet by mouth 3 (three) times daily., Disp: , Rfl:  .  folic acid (FOLVITE) 1 MG tablet, Take 1 mg by mouth daily., Disp: , Rfl:  .  phentermine (ADIPEX-P) 37.5 MG tablet, Take 37.5 mg by mouth every morning., Disp: , Rfl: 0 .  traMADol (ULTRAM) 50 MG tablet, Take by mouth every 6 (six) hours as needed., Disp: , Rfl:   Current Facility-Administered Medications:  .  medroxyPROGESTERone (DEPO-PROVERA) injection 150 mg, 150 mg, Intramuscular, Q90 days, Brock Bad, MD, 150 mg at 10/11/16 1524 Allergies  Allergen Reactions  . Adhesive [Tape] Rash  . Clindamycin/Lincomycin Rash  . Latex     Social History   Tobacco Use  . Smoking status: Never Smoker  . Smokeless tobacco: Never Used  Substance Use Topics  . Alcohol use: No    Comment: social    Family History  Problem Relation Age of Onset  . Diabetes Father   . Hypertension Father   .  Hypertension Mother   . Hypertension Maternal Grandmother   . Diabetes Maternal Grandmother   . Hypertension Maternal Grandfather   . Diabetes Maternal Grandfather   . Hypertension Paternal Grandmother   . Diabetes Paternal Grandmother   . Hypertension Paternal Grandfather   . Diabetes Paternal Grandfather       Review of Systems  Constitutional: negative for fatigue and weight loss Respiratory: negative for cough and wheezing Cardiovascular: negative for chest pain, fatigue and palpitations Gastrointestinal: negative for abdominal pain and change in bowel  habits Musculoskeletal:negative for myalgias Neurological: negative for gait problems and tremors Behavioral/Psych: negative for abusive relationship, depression Endocrine: negative for temperature intolerance    Genitourinary:negative for abnormal menstrual periods, genital lesions, hot flashes, sexual problems and vaginal discharge Integument/breast: negative for breast lump, breast tenderness, nipple discharge and skin lesion(s)    Objective:       BP 130/75   Pulse 86   Ht 5\' 6"  (1.676 m)   Wt (!) 424 lb 12.8 oz (192.7 kg)   BMI 68.56 kg/m  General:   alert  Skin:   no rash or abnormalities  Lungs:   clear to auscultation bilaterally  Heart:   regular rate and rhythm, S1, S2 normal, no murmur, click, rub or gallop  Breasts:   normal without suspicious masses, skin or nipple changes or axillary nodes  Abdomen:  normal findings: no organomegaly, soft, non-tender and no hernia  Pelvis:  External genitalia: normal general appearance Urinary system: urethral meatus normal and bladder without fullness, nontender Vaginal: normal without tenderness, induration or masses Cervix: normal appearance Adnexa: normal bimanual exam Uterus: anteverted and non-tender, normal size   Lab Review Urine pregnancy test Labs reviewed yes Radiologic studies reviewed no  50% of 20 min visit spent on counseling and coordination of care.   Assessment:     1. Encounter for routine gynecological examination with Papanicolaou smear of cervix Rx: - Cytology - PAP( [place])  2. Dysplasia of cervix, low grade (CIN 1) ( Colposcopic Biopsies and ECC )  3. Vaginal discharge - Cervicovaginal ancillary only( [place])  4. Encounter for surveillance of injectable contraceptive - pleased with Depo, but may have excessive weight gain.  Will monitor weight.  5. Class 3 severe obesity due to excess calories without serious comorbidity with body mass index (BMI) of 60.0 to 69.9 in adult [facility]) -  program of caloric restriction, exercise and behavioral modification recommended  6. HTN (hypertension), benign - stable.  Managed by PCP    Plan:    Education reviewed: calcium supplements, depression evaluation, low fat, low cholesterol diet, safe sex/STD prevention, self breast exams and weight bearing exercise. Contraception: Depo-Provera injections. Follow up in: 1 year.   No orders of the defined types were placed in this encounter.  No orders of the defined types were placed in this encounter.   Brock Bad MD 11-16-2017

## 2017-11-17 LAB — CERVICOVAGINAL ANCILLARY ONLY
Bacterial vaginitis: NEGATIVE
Candida vaginitis: NEGATIVE
Chlamydia: NEGATIVE
NEISSERIA GONORRHEA: NEGATIVE
Trichomonas: POSITIVE — AB

## 2017-11-18 ENCOUNTER — Other Ambulatory Visit: Payer: Self-pay | Admitting: Obstetrics

## 2017-11-18 DIAGNOSIS — A5901 Trichomonal vulvovaginitis: Secondary | ICD-10-CM

## 2017-11-18 MED ORDER — METRONIDAZOLE 500 MG PO TABS: 2000.0000 mg | ORAL_TABLET | Freq: Once | ORAL | 0 refills | Status: AC

## 2017-11-21 LAB — CYTOLOGY - PAP
Diagnosis: UNDETERMINED — AB
HPV (WINDOPATH): DETECTED — AB
HPV 16/18/45 genotyping: NEGATIVE

## 2017-11-22 ENCOUNTER — Other Ambulatory Visit: Payer: Self-pay | Admitting: Obstetrics

## 2017-11-22 DIAGNOSIS — A5901 Trichomonal vulvovaginitis: Secondary | ICD-10-CM

## 2017-11-22 MED ORDER — TINIDAZOLE 500 MG PO TABS: 2.0000 g | ORAL_TABLET | Freq: Once | ORAL | 0 refills | Status: AC

## 2017-12-06 ENCOUNTER — Ambulatory Visit (INDEPENDENT_AMBULATORY_CARE_PROVIDER_SITE_OTHER): Payer: Medicaid Other

## 2017-12-06 DIAGNOSIS — Z3042 Encounter for surveillance of injectable contraceptive: Secondary | ICD-10-CM

## 2017-12-06 MED ORDER — MEDROXYPROGESTERONE ACETATE 150 MG/ML IM SUSP
150.0000 mg | Freq: Once | INTRAMUSCULAR | Status: AC
  Administered 2017-12-06: 150 mg via INTRAMUSCULAR

## 2017-12-06 NOTE — Progress Notes (Signed)
Pt presents for Depo injection today Injection given in LUOQ w/o any problems Next injection due 02/21/17-03/07/17 Pt made aware  Pt supplied.

## 2017-12-20 ENCOUNTER — Encounter: Payer: Medicaid Other | Admitting: Obstetrics

## 2018-01-02 ENCOUNTER — Ambulatory Visit: Payer: Medicaid Other

## 2018-01-03 ENCOUNTER — Telehealth: Payer: Self-pay | Admitting: Obstetrics

## 2018-01-03 ENCOUNTER — Other Ambulatory Visit: Payer: Self-pay | Admitting: Obstetrics

## 2018-01-03 NOTE — Telephone Encounter (Signed)
Spoke with patient to discuss her last pap smear after Cryocautery of Cervix for LGSIL, and recommendations for management.  Pap smear was ASCUS with negative High Risk HPV.   A/P:  ASCUS with negative HPV.  Repeat pap in 1 year  Brock BadHARLES A. Mikhael Hendriks MD 01-03-2018

## 2018-01-06 ENCOUNTER — Encounter: Payer: Medicaid Other | Admitting: Obstetrics

## 2018-01-31 ENCOUNTER — Telehealth: Payer: Self-pay

## 2018-01-31 NOTE — Telephone Encounter (Signed)
Lifestyle changes I.e. exercise should not cause abnormal bleeding on Depo.  Any new sexual partners? I would be more concerned about possible Chlamydial infection.  She probably needs an exam and GC / CH cultures.

## 2018-01-31 NOTE — Telephone Encounter (Signed)
TC from patient c/o bleeding pt is on Depo states she has not had any problems before with breakthrough bleeding. Pt also notes cramps that are very painful.  Pt recently making life style changes and working out x 5 days a week .  Pt questioned was lifestyle change contributing to bleeding? Please advise on what patient should do.

## 2018-02-01 NOTE — Telephone Encounter (Signed)
TC to pt to make aware to make appt. Pt was not ava lmovm.

## 2018-02-27 ENCOUNTER — Ambulatory Visit: Payer: Medicaid Other

## 2018-02-27 ENCOUNTER — Ambulatory Visit (INDEPENDENT_AMBULATORY_CARE_PROVIDER_SITE_OTHER): Payer: Medicaid Other

## 2018-02-27 ENCOUNTER — Other Ambulatory Visit: Payer: Self-pay

## 2018-02-27 DIAGNOSIS — Z3042 Encounter for surveillance of injectable contraceptive: Secondary | ICD-10-CM | POA: Diagnosis not present

## 2018-02-27 MED ORDER — MEDROXYPROGESTERONE ACETATE 150 MG/ML IM SUSP: 150.0000 mg | INTRAMUSCULAR | 3 refills | Status: DC

## 2018-02-27 NOTE — Progress Notes (Signed)
Patient is in the office for depo injection, administered and pt tolerated well .Marland Kitchen Administrations This Visit    medroxyPROGESTERone (DEPO-PROVERA) injection 150 mg    Admin Date 02/27/2018 Action Given Dose 150 mg Route Intramuscular Administered By Katrina Stack, RN

## 2018-02-28 NOTE — Progress Notes (Signed)
I have reviewed the chart and agree with nursing staff's documentation of this patient's encounter.  Catalina Antigua, MD 02/28/2018 10:45 AM

## 2018-06-22 ENCOUNTER — Other Ambulatory Visit: Payer: Self-pay

## 2018-06-22 ENCOUNTER — Ambulatory Visit (INDEPENDENT_AMBULATORY_CARE_PROVIDER_SITE_OTHER): Payer: Medicaid Other

## 2018-06-22 VITALS — BP 102/64 | HR 110 | Ht 66.0 in | Wt >= 6400 oz

## 2018-06-22 DIAGNOSIS — Z3042 Encounter for surveillance of injectable contraceptive: Secondary | ICD-10-CM | POA: Diagnosis not present

## 2018-06-22 MED ORDER — MEDROXYPROGESTERONE ACETATE 150 MG/ML IM SUSP
150.0000 mg | Freq: Once | INTRAMUSCULAR | Status: AC
  Administered 2018-06-22: 16:00:00 150 mg via INTRAMUSCULAR

## 2018-06-22 NOTE — Progress Notes (Signed)
Presented for DEPO Injection, given RUOQ, tolerated well.  Next DEPO August 20 - Sept. 03/2018  Administrations This Visit    medroxyPROGESTERone (DEPO-PROVERA) injection 150 mg    Admin Date 06/22/2018 Action Given Dose 150 mg Route Intramuscular Administered By Maretta Bees, RMA

## 2018-09-14 ENCOUNTER — Other Ambulatory Visit: Payer: Self-pay

## 2018-09-14 ENCOUNTER — Ambulatory Visit (INDEPENDENT_AMBULATORY_CARE_PROVIDER_SITE_OTHER): Payer: Medicaid Other

## 2018-09-14 VITALS — BP 140/81 | HR 98 | Ht 67.0 in | Wt >= 6400 oz

## 2018-09-14 DIAGNOSIS — Z3042 Encounter for surveillance of injectable contraceptive: Secondary | ICD-10-CM | POA: Diagnosis not present

## 2018-09-14 MED ORDER — MEDROXYPROGESTERONE ACETATE 150 MG/ML IM SUSP
150.0000 mg | Freq: Once | INTRAMUSCULAR | Status: AC
  Administered 2018-09-14: 150 mg via INTRAMUSCULAR

## 2018-09-14 NOTE — Progress Notes (Signed)
Presents for DEPO, Injection given in LUOQ, tolerated well.  Needs Annual Exam before next DEPO.  Next DEPO Nov. 12-26/2020  Administrations This Visit    medroxyPROGESTERone (DEPO-PROVERA) injection 150 mg    Admin Date 09/14/2018 Action Given Dose 150 mg Route Intramuscular Administered By Tamela Oddi, RMA

## 2018-12-05 ENCOUNTER — Other Ambulatory Visit: Payer: Self-pay | Admitting: Obstetrics and Gynecology

## 2018-12-05 ENCOUNTER — Other Ambulatory Visit: Payer: Self-pay

## 2018-12-05 ENCOUNTER — Ambulatory Visit: Payer: Medicaid Other

## 2018-12-05 ENCOUNTER — Ambulatory Visit: Payer: Medicaid Other | Admitting: Obstetrics and Gynecology

## 2018-12-05 ENCOUNTER — Encounter: Payer: Self-pay | Admitting: Obstetrics and Gynecology

## 2018-12-05 ENCOUNTER — Other Ambulatory Visit (HOSPITAL_COMMUNITY)
Admission: RE | Admit: 2018-12-05 | Discharge: 2018-12-05 | Disposition: A | Discharge status: Home / Self Care | Payer: Medicaid Other | Source: Ambulatory Visit | Attending: Obstetrics and Gynecology | Admitting: Obstetrics and Gynecology

## 2018-12-05 VITALS — BP 125/78 | HR 80 | Wt >= 6400 oz

## 2018-12-05 DIAGNOSIS — Z3042 Encounter for surveillance of injectable contraceptive: Secondary | ICD-10-CM | POA: Diagnosis not present

## 2018-12-05 DIAGNOSIS — Z01419 Encounter for gynecological examination (general) (routine) without abnormal findings: Secondary | ICD-10-CM | POA: Insufficient documentation

## 2018-12-05 DIAGNOSIS — Z0000 Encounter for general adult medical examination without abnormal findings: Secondary | ICD-10-CM

## 2018-12-05 MED ORDER — MEDROXYPROGESTERONE ACETATE 150 MG/ML IM SUSP
150.0000 mg | Freq: Once | INTRAMUSCULAR | Status: AC
  Administered 2018-12-05: 15:00:00 150 mg via INTRAMUSCULAR

## 2018-12-05 NOTE — Addendum Note (Signed)
Addended by: Delrae Alfred on: 12/05/2018 03:20 PM   Modules accepted: Orders

## 2018-12-05 NOTE — Progress Notes (Signed)
Subjective:     Shannon Huffman is a 33 y.o. female P0 with BMI 67 who is here for a comprehensive physical exam. The patient reports no problems. Patient is not currently sexually active and is using depo-provera for contraception. She denies any pelvic pain or abnormal discharge. She is without any current complaints  Past Medical History:  Diagnosis Date  . Asthma   . Epilepsy ([facility])   . Hypertension 06/2016  . Morbid obesity ([facility])   . Seizures ([facility])   . Sleep apnea    Past Surgical History:  Procedure Laterality Date  . right knee surgery Right Jan 2014  . TONSILLECTOMY AND ADENOIDECTOMY  childhood   Family History  Problem Relation Age of Onset  . Diabetes Father   . Hypertension Father   . Hypertension Mother   . Hypertension Maternal Grandmother   . Diabetes Maternal Grandmother   . Hypertension Maternal Grandfather   . Diabetes Maternal Grandfather   . Hypertension Paternal Grandmother   . Diabetes Paternal Grandmother   . Hypertension Paternal Grandfather   . Diabetes Paternal Grandfather     Social History   Socioeconomic History  . Marital status: Single    Spouse name: Not on file  . Number of children: Not on file  . Years of education: Not on file  . Highest education level: Not on file  Occupational History  . Not on file  Social Needs  . Financial resource strain: Not on file  . Food insecurity    Worry: Not on file    Inability: Not on file  . Transportation needs    Medical: Not on file    Non-medical: Not on file  Tobacco Use  . Smoking status: Never Smoker  . Smokeless tobacco: Never Used  Substance and Sexual Activity  . Alcohol use: No    Comment: social  . Drug use: No  . Sexual activity: Not Currently    Birth control/protection: None, Injection  Lifestyle  . Physical activity    Days per week: Not on file    Minutes per session: Not on file  . Stress: Not on file  Relationships  . Social Musician on phone: Not  on file    Gets together: Not on file    Attends religious service: Not on file    Active member of club or organization: Not on file    Attends meetings of clubs or organizations: Not on file    Relationship status: Not on file  . Intimate partner violence    Fear of current or ex partner: Not on file    Emotionally abused: Not on file    Physically abused: Not on file    Forced sexual activity: Not on file  Other Topics Concern  . Not on file  Social History Narrative  . Not on file   Health Maintenance  Topic Date Due  . HIV Screening  02/17/2001  . TETANUS/TDAP  02/17/2005  . INFLUENZA VACCINE  08/19/2018  . PAP SMEAR-Modifier  11/16/2020       Review of Systems Pertinent items are noted in HPI.   Objective:  Blood pressure 125/78, pulse 80, weight (!) 442 lb (200.5 kg).     GENERAL: Well-developed, well-nourished female in no acute distress.  HEENT: Normocephalic, atraumatic. Sclerae anicteric.  NECK: Supple. Normal thyroid.  LUNGS: Clear to auscultation bilaterally.  HEART: Regular rate and rhythm. BREASTS: Symmetric in size. No palpable masses or lymphadenopathy, skin changes,  or nipple drainage. ABDOMEN: Soft, nontender, nondistended. No organomegaly. PELVIC: Normal external female genitalia. Vagina is pink and rugated.  Normal discharge. Normal appearing cervix. Bimanual exam limited secondary to body habitus. No adnexal mass or tenderness. EXTREMITIES: No cyanosis, clubbing, or edema, 2+ distal pulses.    Assessment:    Healthy female exam.      Plan:    pap smear collected STI screening per patient request Discussed weight loss management and patient referred to nutritionist Patient will be contacted with abnormal results See After Visit Summary for Counseling Recommendations

## 2018-12-06 LAB — HEPATITIS B SURFACE ANTIGEN: Hepatitis B Surface Ag: NEGATIVE

## 2018-12-06 LAB — RPR: RPR Ser Ql: NONREACTIVE

## 2018-12-06 LAB — HIV ANTIBODY (ROUTINE TESTING W REFLEX): HIV Screen 4th Generation wRfx: NONREACTIVE

## 2018-12-06 LAB — CERVICOVAGINAL ANCILLARY ONLY
Chlamydia: NEGATIVE
Comment: NEGATIVE
Comment: NORMAL
Neisseria Gonorrhea: NEGATIVE

## 2018-12-06 LAB — HEPATITIS C ANTIBODY: Hep C Virus Ab: 0.1 s/co ratio (ref 0.0–0.9)

## 2018-12-11 LAB — CYTOLOGY - PAP
Adequacy: ABSENT
Comment: NEGATIVE
Comment: NEGATIVE
Comment: NEGATIVE
Diagnosis: UNDETERMINED — AB
HPV 16: NEGATIVE
HPV 18 / 45: NEGATIVE
High risk HPV: POSITIVE — AB

## 2018-12-20 ENCOUNTER — Encounter: Payer: Self-pay | Admitting: *Deleted

## 2018-12-25 ENCOUNTER — Other Ambulatory Visit: Payer: Self-pay

## 2018-12-25 DIAGNOSIS — N898 Other specified noninflammatory disorders of vagina: Secondary | ICD-10-CM

## 2018-12-25 MED ORDER — FLUCONAZOLE 150 MG PO TABS: 150.0000 mg | ORAL_TABLET | Freq: Once | ORAL | 3 refills | Status: AC

## 2018-12-25 NOTE — Progress Notes (Signed)
Pt reports symptoms of white discharge and itching for a couple days. Diflucan Rx sent per protocol. Pt made aware.

## 2019-01-17 ENCOUNTER — Encounter: Payer: Self-pay | Admitting: Obstetrics and Gynecology

## 2019-01-17 ENCOUNTER — Other Ambulatory Visit (HOSPITAL_COMMUNITY)
Admission: RE | Admit: 2019-01-17 | Discharge: 2019-01-17 | Disposition: A | Discharge status: Home / Self Care | Payer: Medicaid Other | Source: Ambulatory Visit | Attending: Obstetrics and Gynecology | Admitting: Obstetrics and Gynecology

## 2019-01-17 ENCOUNTER — Other Ambulatory Visit: Payer: Self-pay

## 2019-01-17 ENCOUNTER — Ambulatory Visit: Payer: Medicaid Other | Admitting: Obstetrics and Gynecology

## 2019-01-17 DIAGNOSIS — R87610 Atypical squamous cells of undetermined significance on cytologic smear of cervix (ASC-US): Secondary | ICD-10-CM | POA: Insufficient documentation

## 2019-01-17 DIAGNOSIS — R87810 Cervical high risk human papillomavirus (HPV) DNA test positive: Secondary | ICD-10-CM | POA: Diagnosis present

## 2019-01-17 DIAGNOSIS — Z3202 Encounter for pregnancy test, result negative: Secondary | ICD-10-CM

## 2019-01-17 DIAGNOSIS — N870 Mild cervical dysplasia: Secondary | ICD-10-CM | POA: Diagnosis not present

## 2019-01-17 LAB — POCT URINE PREGNANCY: Preg Test, Ur: NEGATIVE

## 2019-01-17 NOTE — Progress Notes (Signed)
Pt is here for colposcopy. Abnormal pap 12/05/18 (ASCUS with pos. High risk HPV).

## 2019-01-17 NOTE — Progress Notes (Signed)
Patient with ASCUS +HPV pap smear on 12/05/2018 here for colposcopy  Patient given informed consent, signed copy in the chart, time out was performed.  Placed in lithotomy position. Cervix viewed with speculum and colposcope after application of acetic acid.   Colposcopy adequate?  yes Acetowhite lesions? no Punctation? no Mosaicism?  no Abnormal vasculature?  no Biopsies? no ECC? yes  COMMENTS:  Patient was given post procedure instructions.  She will return in 2 weeks for results.  Mora Bellman, MD

## 2019-01-22 LAB — SURGICAL PATHOLOGY

## 2019-02-26 ENCOUNTER — Other Ambulatory Visit: Payer: Self-pay | Admitting: Obstetrics

## 2019-02-26 ENCOUNTER — Other Ambulatory Visit: Payer: Self-pay | Admitting: *Deleted

## 2019-02-26 DIAGNOSIS — Z3042 Encounter for surveillance of injectable contraceptive: Secondary | ICD-10-CM

## 2019-02-26 MED ORDER — MEDROXYPROGESTERONE ACETATE 150 MG/ML IM SUSP: 150.0000 mg | INTRAMUSCULAR | 3 refills | Status: DC

## 2019-02-26 NOTE — Progress Notes (Signed)
Pt called for refill on Depo.  Pt had AEX in November. Refill was sent in today.

## 2019-02-27 ENCOUNTER — Other Ambulatory Visit: Payer: Self-pay

## 2019-02-27 ENCOUNTER — Ambulatory Visit (INDEPENDENT_AMBULATORY_CARE_PROVIDER_SITE_OTHER): Payer: Medicaid Other

## 2019-02-27 DIAGNOSIS — Z3042 Encounter for surveillance of injectable contraceptive: Secondary | ICD-10-CM

## 2019-02-27 MED ORDER — MEDROXYPROGESTERONE ACETATE 150 MG/ML IM SUSP
150.0000 mg | Freq: Once | INTRAMUSCULAR | Status: AC
  Administered 2019-02-27: 150 mg via INTRAMUSCULAR

## 2019-02-27 NOTE — Progress Notes (Signed)
Pt is here for a depo injection. Pt tolerated injection well the RUOQ without complications. Pt should make next appointment around 4/27 - 5/11. -EH/RMA

## 2019-05-18 ENCOUNTER — Other Ambulatory Visit: Payer: Self-pay

## 2019-05-18 ENCOUNTER — Ambulatory Visit (INDEPENDENT_AMBULATORY_CARE_PROVIDER_SITE_OTHER): Payer: Medicaid Other | Admitting: *Deleted

## 2019-05-18 DIAGNOSIS — Z3042 Encounter for surveillance of injectable contraceptive: Secondary | ICD-10-CM | POA: Diagnosis not present

## 2019-05-18 DIAGNOSIS — R399 Unspecified symptoms and signs involving the genitourinary system: Secondary | ICD-10-CM | POA: Diagnosis not present

## 2019-05-18 LAB — POCT URINALYSIS DIPSTICK
Bilirubin, UA: NEGATIVE
Glucose, UA: NEGATIVE
Ketones, UA: NEGATIVE
Leukocytes, UA: NEGATIVE
Nitrite, UA: NEGATIVE
Protein, UA: NEGATIVE
Spec Grav, UA: 1.015 (ref 1.010–1.025)
Urobilinogen, UA: 0.2 E.U./dL
pH, UA: 6 (ref 5.0–8.0)

## 2019-05-18 NOTE — Progress Notes (Signed)
Pt is in office for Depo injection. Pt is on time for Depo. Injection given, pt tolerated well.  Pt states she feels like she may have UTI. Pt states she is having some slight discomfort with urination.  Udip performed and UC sent today.   Urine dipstick shows positive for red blood cells.   Administrations This Visit    medroxyPROGESTERone (DEPO-PROVERA) injection 150 mg    Admin Date 05/18/2019 Action Given Dose 150 mg Route Intramuscular Administered By Lanney Gins, CMA

## 2019-05-20 LAB — URINE CULTURE: Organism ID, Bacteria: NO GROWTH

## 2019-05-23 ENCOUNTER — Ambulatory Visit: Payer: Medicaid Other

## 2019-06-04 ENCOUNTER — Other Ambulatory Visit: Payer: Self-pay

## 2019-06-04 ENCOUNTER — Other Ambulatory Visit (HOSPITAL_COMMUNITY): Payer: Self-pay | Admitting: Internal Medicine

## 2019-06-04 ENCOUNTER — Ambulatory Visit (HOSPITAL_COMMUNITY)
Admission: RE | Admit: 2019-06-04 | Discharge: 2019-06-04 | Disposition: A | Discharge status: Home / Self Care | Payer: Medicaid Other | Source: Ambulatory Visit | Attending: Internal Medicine | Admitting: Internal Medicine

## 2019-06-04 DIAGNOSIS — J45909 Unspecified asthma, uncomplicated: Secondary | ICD-10-CM | POA: Diagnosis not present

## 2019-06-04 DIAGNOSIS — R0602 Shortness of breath: Secondary | ICD-10-CM | POA: Diagnosis not present

## 2019-06-04 DIAGNOSIS — Z01818 Encounter for other preprocedural examination: Secondary | ICD-10-CM

## 2019-06-04 DIAGNOSIS — R609 Edema, unspecified: Secondary | ICD-10-CM | POA: Insufficient documentation

## 2019-08-08 ENCOUNTER — Other Ambulatory Visit: Payer: Self-pay

## 2019-08-08 ENCOUNTER — Ambulatory Visit (INDEPENDENT_AMBULATORY_CARE_PROVIDER_SITE_OTHER): Payer: Medicaid Other

## 2019-08-08 DIAGNOSIS — Z3042 Encounter for surveillance of injectable contraceptive: Secondary | ICD-10-CM | POA: Diagnosis not present

## 2019-08-08 NOTE — Progress Notes (Signed)
Patient was assessed and managed by nursing staff during this encounter. I have reviewed the chart and agree with the documentation and plan. I have also made any necessary editorial changes.  Tzvi Economou, MD 08/08/2019 2:13 PM    

## 2019-08-08 NOTE — Progress Notes (Signed)
Nurse visit for pt supply Depo+ Pt is on time for inj. Depo given RUOQ without difficulty Next Depo due Oct 6-20  Administrations This Visit    medroxyPROGESTERone (DEPO-PROVERA) injection 150 mg    Admin Date 08/08/2019 Action Given Dose 150 mg Route Intramuscular Administered By Lewayne Bunting, CMA

## 2019-10-08 HISTORY — PX: LAPAROSCOPIC GASTRIC SLEEVE RESECTION: SHX5895

## 2019-10-30 ENCOUNTER — Ambulatory Visit: Payer: Medicaid Other

## 2020-07-22 ENCOUNTER — Ambulatory Visit: Payer: Medicaid Other | Admitting: Obstetrics

## 2020-09-25 ENCOUNTER — Other Ambulatory Visit: Payer: Self-pay

## 2020-09-25 ENCOUNTER — Ambulatory Visit (INDEPENDENT_AMBULATORY_CARE_PROVIDER_SITE_OTHER): Payer: Medicaid Other | Admitting: Obstetrics

## 2020-09-25 ENCOUNTER — Encounter: Payer: Self-pay | Admitting: Obstetrics

## 2020-09-25 VITALS — BP 139/87 | HR 70 | Wt 337.2 lb

## 2020-09-25 DIAGNOSIS — Z3009 Encounter for other general counseling and advice on contraception: Secondary | ICD-10-CM | POA: Diagnosis not present

## 2020-09-25 DIAGNOSIS — N939 Abnormal uterine and vaginal bleeding, unspecified: Secondary | ICD-10-CM

## 2020-09-25 DIAGNOSIS — Z6841 Body Mass Index (BMI) 40.0 and over, adult: Secondary | ICD-10-CM

## 2020-09-25 DIAGNOSIS — Z9884 Bariatric surgery status: Secondary | ICD-10-CM | POA: Diagnosis not present

## 2020-09-25 MED ORDER — MEDROXYPROGESTERONE ACETATE 10 MG PO TABS
10.0000 mg | ORAL_TABLET | Freq: Every day | ORAL | 0 refills | Status: DC
Start: 2020-09-25 — End: 2020-09-30

## 2020-09-25 NOTE — Progress Notes (Signed)
Subjective:        Shannon Huffman is a 35 y.o. female here for a routine exam.  Current complaints: Heavy and prolonged periods.    Personal health questionnaire:  Is patient Ashkenazi Jewish, have a family history of breast and/or ovarian cancer: no Is there a family history of uterine cancer diagnosed at age < 61, gastrointestinal cancer, urinary tract cancer, family member who is a Personnel officer syndrome-associated carrier: no Is the patient overweight and hypertensive, family history of diabetes, personal history of gestational diabetes, preeclampsia or PCOS: no Is patient over 11, have PCOS,  family history of premature CHD under age 4, diabetes, smoke, have hypertension or peripheral artery disease:  no At any time, has a partner hit, kicked or otherwise hurt or frightened you?: no Over the past 2 weeks, have you felt down, depressed or hopeless?: no Over the past 2 weeks, have you felt little interest or pleasure in doing things?:no   Gynecologic History Patient's last menstrual period was 09/22/2020. Contraception: none Last Pap: 2020. Results were: normal Last mammogram: n/a. Results were: n/a  Obstetric History OB History  Gravida Para Term Preterm AB Living  0 0 0 0 0 0  SAB IAB Ectopic Multiple Live Births  0 0 0 0      Past Medical History:  Diagnosis Date   Asthma    Epilepsy (HCC)    Hypertension 06/2016   Morbid obesity (HCC)    Seizures (HCC)    Sleep apnea     Past Surgical History:  Procedure Laterality Date   right knee surgery Right Jan 2014   TONSILLECTOMY AND ADENOIDECTOMY  childhood     Current Outpatient Medications:    albuterol (PROVENTIL HFA;VENTOLIN HFA) 108 (90 BASE) MCG/ACT inhaler, Inhale 2 puffs into the lungs every 6 (six) hours as needed for wheezing or shortness of breath. , Disp: , Rfl:    atenolol (TENORMIN) 50 MG tablet, Take 50 mg by mouth daily., Disp: , Rfl:    cholecalciferol (VITAMIN D) 1000 units tablet, Take 1,000 Units by  mouth daily., Disp: , Rfl:    folic acid (FOLVITE) 1 MG tablet, Take 1 mg by mouth daily., Disp: , Rfl:    hydrochlorothiazide (HYDRODIURIL) 25 MG tablet, Take 25 mg by mouth daily., Disp: , Rfl:    medroxyPROGESTERone (DEPO-PROVERA) 150 MG/ML injection, INJECT 1 ML INTO THE MUSCLE EVERY 3 MONTHS., Disp: 1 mL, Rfl: 3   medroxyPROGESTERone (DEPO-PROVERA) 150 MG/ML injection, Inject 1 mL (150 mg total) into the muscle every 3 (three) months., Disp: 1 mL, Rfl: 3   mometasone (NASONEX) 50 MCG/ACT nasal spray, Place into the nose., Disp: , Rfl:    oxyCODONE-acetaminophen (PERCOCET) 10-325 MG tablet, Take 1 tablet by mouth 3 (three) times daily., Disp: , Rfl:    phentermine (ADIPEX-P) 37.5 MG tablet, Take 37.5 mg by mouth every morning., Disp: , Rfl: 0   traMADol (ULTRAM) 50 MG tablet, Take by mouth every 6 (six) hours as needed., Disp: , Rfl:   Current Facility-Administered Medications:    medroxyPROGESTERone (DEPO-PROVERA) injection 150 mg, 150 mg, Intramuscular, Q90 days, Brock Bad, MD, 150 mg at 08/08/19 1045 Allergies  Allergen Reactions   Adhesive [Tape] Rash   Clindamycin/Lincomycin Rash   Latex     Social History   Tobacco Use   Smoking status: Never   Smokeless tobacco: Never  Substance Use Topics   Alcohol use: No    Comment: social    Family History  Problem  Relation Age of Onset   Diabetes Father    Hypertension Father    Hypertension Mother    Hypertension Maternal Grandmother    Diabetes Maternal Grandmother    Hypertension Maternal Grandfather    Diabetes Maternal Grandfather    Hypertension Paternal Grandmother    Diabetes Paternal Grandmother    Hypertension Paternal Grandfather    Diabetes Paternal Grandfather       Review of Systems  Constitutional: negative for fatigue and weight loss Respiratory: negative for cough and wheezing Cardiovascular: negative for chest pain, fatigue and palpitations Gastrointestinal: negative for abdominal pain and  change in bowel habits Musculoskeletal:negative for myalgias Neurological: negative for gait problems and tremors Behavioral/Psych: negative for abusive relationship, depression Endocrine: negative for temperature intolerance    Genitourinary: positive for abnormal menstrual periods.  Negative for genital lesions, hot flashes, sexual problems and vaginal discharge Integument/breast: negative for breast lump, breast tenderness, nipple discharge and skin lesion(s)    Objective:       BP 139/87   Pulse 70   Wt (!) 337 lb 3.2 oz (153 kg)   LMP 09/22/2020   BMI 52.81 kg/m  General:   Alert and no distress  Skin:   no rash or abnormalities  Lungs:   clear to auscultation bilaterally  Heart:   regular rate and rhythm, S1, S2 normal, no murmur, click, rub or gallop  Breasts:   normal without suspicious masses, skin or nipple changes or axillary nodes  Abdomen:  normal findings: no organomegaly, soft, non-tender and no hernia  Pelvis:  External genitalia: normal general appearance Urinary system: urethral meatus normal and bladder without fullness, nontender Vaginal: normal without tenderness, induration or masses Cervix: normal appearance Adnexa: normal bimanual exam Uterus: anteverted and non-tender, normal size   Lab Review Urine pregnancy test Labs reviewed yes Radiologic studies reviewed no  I have spent a total of 20 minutes of face-to-face time, excluding clinical staff time, reviewing notes and preparing to see patient, ordering tests and/or medications, and counseling the patient.    Assessment:    1. Abnormal uterine bleeding (AUB) Rx: - US PELVIC COMPLETE WITH TRANSVAGINAL; Future - medroxyPROGESTERone (PROVERA) 10 MG tablet; Take 1 tablet (10 mg total) by mouth daily.  Dispense: 14 tablet; Refill: 0  2. Encounter for counseling regarding contraception - discussed options - considering OCP's for cycle regulation  3. Class 3 severe obesity due to excess calories  without serious comorbidity with body mass index (BMI) of 50.0 to 59.9 in adult (HCC)  4. S/P bariatric surgery      Plan:    Education reviewed: calcium supplements, depression evaluation, low fat, low cholesterol diet, safe sex/STD prevention, self breast exams, and weight bearing exercise. Contraception: OCP (estrogen/progesterone). Follow up in: 3 weeks.  Annual / Pap    Brock Bad, MD 09/25/2020 3:11 PM

## 2020-09-25 NOTE — Progress Notes (Signed)
Patient presents for AUB

## 2020-09-30 ENCOUNTER — Telehealth: Payer: Self-pay | Admitting: *Deleted

## 2020-09-30 ENCOUNTER — Other Ambulatory Visit: Payer: Self-pay | Admitting: *Deleted

## 2020-09-30 DIAGNOSIS — N939 Abnormal uterine and vaginal bleeding, unspecified: Secondary | ICD-10-CM

## 2020-09-30 MED ORDER — MEDROXYPROGESTERONE ACETATE 10 MG PO TABS: 10.0000 mg | ORAL_TABLET | Freq: Every day | ORAL | 0 refills | Status: DC

## 2020-09-30 NOTE — Telephone Encounter (Signed)
Returned TC to patient regarding request for Provera refill. RX refilled per Dr. Clearance Coots.

## 2020-10-15 ENCOUNTER — Ambulatory Visit
Admission: RE | Admit: 2020-10-15 | Discharge: 2020-10-15 | Disposition: A | Discharge status: Home / Self Care | Payer: Medicaid Other | Source: Ambulatory Visit | Attending: Obstetrics | Admitting: Obstetrics

## 2020-10-15 ENCOUNTER — Other Ambulatory Visit: Payer: Self-pay

## 2020-10-15 DIAGNOSIS — N939 Abnormal uterine and vaginal bleeding, unspecified: Secondary | ICD-10-CM | POA: Insufficient documentation

## 2020-11-19 ENCOUNTER — Telehealth: Payer: Self-pay | Admitting: Obstetrics

## 2020-11-19 NOTE — Telephone Encounter (Signed)
Patient left voicemail requesting refill on her Provera.    Message routed to provider for approval and/or plan.

## 2020-11-20 ENCOUNTER — Other Ambulatory Visit: Payer: Self-pay | Admitting: Obstetrics

## 2020-11-26 ENCOUNTER — Encounter: Payer: Self-pay | Admitting: Obstetrics

## 2020-11-26 ENCOUNTER — Telehealth (INDEPENDENT_AMBULATORY_CARE_PROVIDER_SITE_OTHER): Payer: Medicaid Other | Admitting: Obstetrics

## 2020-11-26 DIAGNOSIS — B9689 Other specified bacterial agents as the cause of diseases classified elsewhere: Secondary | ICD-10-CM

## 2020-11-26 DIAGNOSIS — N760 Acute vaginitis: Secondary | ICD-10-CM

## 2020-11-26 DIAGNOSIS — N939 Abnormal uterine and vaginal bleeding, unspecified: Secondary | ICD-10-CM | POA: Diagnosis not present

## 2020-11-26 DIAGNOSIS — E282 Polycystic ovarian syndrome: Secondary | ICD-10-CM

## 2020-11-26 DIAGNOSIS — Z131 Encounter for screening for diabetes mellitus: Secondary | ICD-10-CM

## 2020-11-26 MED ORDER — DOXYCYCLINE HYCLATE 100 MG PO CAPS
100.0000 mg | ORAL_CAPSULE | Freq: Two times a day (BID) | ORAL | 0 refills | Status: AC
Start: 2020-11-26 — End: ?

## 2020-11-26 MED ORDER — ORTHO-NOVUM 1/35 (28) 1-35 MG-MCG PO TABS: 1.0000 | ORAL_TABLET | Freq: Every day | ORAL | 11 refills | Status: DC

## 2020-11-26 MED ORDER — ORTHO-NOVUM 1/35 (28) 1-35 MG-MCG PO TABS: ORAL_TABLET | ORAL | 11 refills | Status: DC

## 2020-11-26 MED ORDER — METRONIDAZOLE 500 MG PO TABS: 500.0000 mg | ORAL_TABLET | Freq: Two times a day (BID) | ORAL | 0 refills | Status: DC

## 2020-11-26 NOTE — Progress Notes (Signed)
MyChart follow up AUB, [place] on 10/15/2020, started Provera 09/25/20.

## 2020-11-26 NOTE — Progress Notes (Signed)
GYNECOLOGY VIRTUAL VISIT ENCOUNTER NOTE  Provider location: Center for [facility]   Patient location: Home  I connected with Shannon Huffman on 11/26/20 at  2:30 PM EST by MyChart Video Encounter and verified that I am speaking with the correct person using two identifiers.   I discussed the limitations, risks, security and privacy concerns of performing an evaluation and management service virtually and the availability of in person appointments. I also discussed with the patient that there may be a patient responsible charge related to this service. The patient expressed understanding and agreed to proceed.   History:  Shannon Huffman is a 35 y.o. G0P0000 female being evaluated today for ultrasound results and follow up for AUB.  She continues to have heavy vaginal bleeding.  She denies any abnormal vaginal discharge, pelvic pain or other concerns.       Past Medical History:  Diagnosis Date   Asthma    Epilepsy (HCC)    Hypertension 06/2016   Morbid obesity (HCC)    Seizures (HCC)    Sleep apnea    Past Surgical History:  Procedure Laterality Date   right knee surgery Right Jan 2014   TONSILLECTOMY AND ADENOIDECTOMY  childhood   The following portions of the patient's history were reviewed and updated as appropriate: allergies, current medications, past family history, past medical history, past social history, past surgical history and problem list.   Health Maintenance:  ASCUS pap and positive HRHPV on 12-05-2018.  Review of Systems:  Pertinent items noted in HPI and remainder of comprehensive ROS otherwise negative.  Physical Exam:   General:  Alert, oriented and cooperative. Patient appears to be in no acute distress.  Mental Status: Normal mood and affect. Normal behavior. Normal judgment and thought content.   Respiratory: Normal respiratory effort, no problems with respiration noted  Rest of physical exam deferred due to type of  encounter  Labs and Imaging No results found for this or any previous visit (from the past 336 hour(s)). No results found.     Assessment and Plan:     1. Abnormal uterine bleeding (AUB) Rx: - norethindrone-ethinyl estradiol 1/35 (ORTHO-NOVUM 1/35, 28,) tablet; Take 1 tablet by mouth daily.  Dispense: 28 tablet; Refill: 11 - CBC  2. PCOS (polycystic ovarian syndrome) Rx: - Hemoglobin A1c  3. BV (bacterial vaginosis) Rx: - metroNIDAZOLE (FLAGYL) 500 MG tablet; Take 1 tablet (500 mg total) by mouth 2 (two) times daily.  Dispense: 14 tablet; Refill: 0 - doxycycline (VIBRAMYCIN) 100 MG capsule; Take 1 capsule (100 mg total) by mouth 2 (two) times daily.  Dispense: 14 capsule; Refill: 0  4. Class 3 severe obesity without serious comorbidity in adult, unspecified BMI, unspecified obesity type (HCC) - weight reduction with the aid of dietary changes, exercise and behavioral modification recommended       I discussed the assessment and treatment plan with the patient. The patient was provided an opportunity to ask questions and all were answered. The patient agreed with the plan and demonstrated an understanding of the instructions.   The patient was advised to call back or seek an in-person evaluation/go to the ED if the symptoms worsen or if the condition fails to improve as anticipated.   I have spent a total of 20 minutes of non-face-to-face time, excluding clinical staff time, reviewing notes and preparing to see patient, ordering tests and/or medications, and counseling the patient.    Coral Ceo, MD Center for [employer],  [facility]  11/26/20

## 2020-11-28 ENCOUNTER — Other Ambulatory Visit: Payer: Self-pay

## 2020-11-28 ENCOUNTER — Other Ambulatory Visit: Payer: Medicaid Other

## 2020-11-28 DIAGNOSIS — E282 Polycystic ovarian syndrome: Secondary | ICD-10-CM

## 2020-11-28 DIAGNOSIS — N939 Abnormal uterine and vaginal bleeding, unspecified: Secondary | ICD-10-CM

## 2020-11-29 LAB — CBC
Hematocrit: 37.1 % (ref 34.0–46.6)
Hemoglobin: 12.1 g/dL (ref 11.1–15.9)
MCH: 25.4 pg — ABNORMAL LOW (ref 26.6–33.0)
MCHC: 32.6 g/dL (ref 31.5–35.7)
MCV: 78 fL — ABNORMAL LOW (ref 79–97)
Platelets: 257 10*3/uL (ref 150–450)
RBC: 4.77 x10E6/uL (ref 3.77–5.28)
RDW: 14.4 % (ref 11.7–15.4)
WBC: 4.1 10*3/uL (ref 3.4–10.8)

## 2020-11-29 LAB — HEMOGLOBIN A1C
Est. average glucose Bld gHb Est-mCnc: 105 mg/dL
Hgb A1c MFr Bld: 5.3 % (ref 4.8–5.6)

## 2021-08-07 ENCOUNTER — Other Ambulatory Visit (HOSPITAL_COMMUNITY)
Admission: RE | Admit: 2021-08-07 | Discharge: 2021-08-07 | Disposition: A | Discharge status: Home / Self Care | Payer: Medicaid Other | Source: Ambulatory Visit | Attending: Obstetrics and Gynecology | Admitting: Obstetrics and Gynecology

## 2021-08-07 ENCOUNTER — Ambulatory Visit (INDEPENDENT_AMBULATORY_CARE_PROVIDER_SITE_OTHER): Payer: Medicaid Other | Admitting: Obstetrics and Gynecology

## 2021-08-07 ENCOUNTER — Encounter: Payer: Self-pay | Admitting: Obstetrics and Gynecology

## 2021-08-07 VITALS — BP 128/89 | HR 78 | Ht 66.0 in | Wt 356.8 lb

## 2021-08-07 DIAGNOSIS — Z6841 Body Mass Index (BMI) 40.0 and over, adult: Secondary | ICD-10-CM

## 2021-08-07 DIAGNOSIS — N938 Other specified abnormal uterine and vaginal bleeding: Secondary | ICD-10-CM

## 2021-08-07 DIAGNOSIS — Z01419 Encounter for gynecological examination (general) (routine) without abnormal findings: Secondary | ICD-10-CM | POA: Insufficient documentation

## 2021-08-07 DIAGNOSIS — Z113 Encounter for screening for infections with a predominantly sexual mode of transmission: Secondary | ICD-10-CM | POA: Diagnosis not present

## 2021-08-07 DIAGNOSIS — Z30013 Encounter for initial prescription of injectable contraceptive: Secondary | ICD-10-CM

## 2021-08-07 LAB — POCT URINE PREGNANCY: Preg Test, Ur: NEGATIVE

## 2021-08-07 MED ORDER — MEDROXYPROGESTERONE ACETATE 150 MG/ML IM SUSP
150.0000 mg | Freq: Once | INTRAMUSCULAR | Status: AC
  Administered 2021-08-07: 150 mg via INTRAMUSCULAR

## 2021-08-07 MED ORDER — MEDROXYPROGESTERONE ACETATE 150 MG/ML IM SUSP: 150.0000 mg | INTRAMUSCULAR | 0 refills | Status: DC

## 2021-08-07 NOTE — Progress Notes (Signed)
Pt presents for annual exam and IUD placement. Pt requesting STD testing today.

## 2021-08-07 NOTE — Progress Notes (Signed)
GYNECOLOGY ANNUAL PREVENTATIVE CARE ENCOUNTER NOTE  History:     Shannon Huffman is a 36 y.o. G0P0000 female here for a routine annual gynecologic exam.  Current complaints: heavy menstrual bleeding.   Denies discharge, pelvic pain, problems with intercourse or other gynecologic concerns.   Pt desires mirena IUD to help stop menses.  She has not had endometrial biopsy since 2018.  She has significant risk factors for endometrial hyperplasia/ cancer with morbid obesity and BMI of 57.  Gynecologic History Patient's last menstrual period was 07/31/2021. Contraception: Depo-Provera injections Last Pap: 12/05/18. Results were: ASCUS with positive HPV  Obstetric History OB History  Gravida Para Term Preterm AB Living  0 0 0 0 0 0  SAB IAB Ectopic Multiple Live Births  0 0 0 0      Past Medical History:  Diagnosis Date   Asthma    Epilepsy (HCC)    Hypertension 06/2016   Morbid obesity (HCC)    Seizures (HCC)    Sleep apnea     Past Surgical History:  Procedure Laterality Date   LAPAROSCOPIC GASTRIC SLEEVE RESECTION  10/08/2019   right knee surgery Right 01/19/2012   TONSILLECTOMY AND ADENOIDECTOMY  childhood    Current Outpatient Medications on File Prior to Visit  Medication Sig Dispense Refill   hydrochlorothiazide (HYDRODIURIL) 25 MG tablet Take 25 mg by mouth daily.     mometasone (NASONEX) 50 MCG/ACT nasal spray Place into the nose.     oxyCODONE-acetaminophen (PERCOCET) 10-325 MG tablet Take 1 tablet by mouth 3 (three) times daily.     phentermine (ADIPEX-P) 37.5 MG tablet Take 37.5 mg by mouth every morning.  0   traMADol (ULTRAM) 50 MG tablet Take by mouth every 6 (six) hours as needed.     albuterol (PROVENTIL HFA;VENTOLIN HFA) 108 (90 BASE) MCG/ACT inhaler Inhale 2 puffs into the lungs every 6 (six) hours as needed for wheezing or shortness of breath.      atenolol (TENORMIN) 50 MG tablet Take 50 mg by mouth daily.     cholecalciferol (VITAMIN D) 1000 units  tablet Take 1,000 Units by mouth daily.     doxycycline (VIBRAMYCIN) 100 MG capsule Take 1 capsule (100 mg total) by mouth 2 (two) times daily. 14 capsule 0   folic acid (FOLVITE) 1 MG tablet Take 1 mg by mouth daily. (Patient not taking: Reported on 08/07/2021)     metroNIDAZOLE (FLAGYL) 500 MG tablet Take 1 tablet (500 mg total) by mouth 2 (two) times daily. (Patient not taking: Reported on 08/07/2021) 14 tablet 0   norethindrone-ethinyl estradiol 1/35 (ORTHO-NOVUM 1/35, 28,) tablet Take 1 active tab po every 8 hours with 1st pack, then 1 tab po daily.  Dispense 2 packs with 1st prescription pick-up. (Patient not taking: Reported on 08/07/2021) 28 tablet 11   No current facility-administered medications on file prior to visit.    Allergies  Allergen Reactions   Adhesive [Tape] Rash   Clindamycin/Lincomycin Rash   Latex     Social History:  reports that she has never smoked. She has never used smokeless tobacco. She reports that she does not drink alcohol and does not use drugs.  Family History  Problem Relation Age of Onset   Diabetes Father    Hypertension Father    Hypertension Mother    Hypertension Maternal Grandmother    Diabetes Maternal Grandmother    Hypertension Maternal Grandfather    Diabetes Maternal Grandfather    Hypertension Paternal Grandmother  Diabetes Paternal Grandmother    Hypertension Paternal Grandfather    Diabetes Paternal Grandfather     The following portions of the patient's history were reviewed and updated as appropriate: allergies, current medications, past family history, past medical history, past social history, past surgical history and problem list.  Review of Systems Pertinent items noted in HPI and remainder of comprehensive ROS otherwise negative.  Physical Exam:  BP 128/89   Pulse 78   Ht 5\' 6"  (1.676 m)   Wt (!) 356 lb 12.8 oz (161.8 kg)   LMP 07/31/2021   BMI 57.59 kg/m  CONSTITUTIONAL: Well-developed, obese female in no acute  distress.  HENT:  Normocephalic, atraumatic, External right and left ear normal. Oropharynx is clear and moist EYES: Conjunctivae and EOM are normal.  NECK: Normal range of motion, supple, no masses.  Normal thyroid.  SKIN: Skin is warm and dry. No rash noted. Not diaphoretic. No erythema. No pallor. MUSCULOSKELETAL: Normal range of motion. No tenderness.  No cyanosis, clubbing, or edema.  2+ distal pulses. NEUROLOGIC: Alert and oriented to person, place, and time. Normal reflexes, muscle tone coordination.  PSYCHIATRIC: Normal mood and affect. Normal behavior. Normal judgment and thought content. CARDIOVASCULAR: Normal heart rate noted, regular rhythm RESPIRATORY: Clear to auscultation bilaterally. Effort and breath sounds normal, no problems with respiration noted. BREASTS: Symmetric in size. Scar noted on left breast from previous trauma. Performed in the presence of a chaperone. ABDOMEN: Soft, no distention noted.  No tenderness, rebound or guarding.  PELVIC: Normal appearing external genitalia and urethral meatus; normal appearing vaginal mucosa and cervix.  No abnormal discharge noted.  Pap smear obtained.  Normal uterine size, no other palpable masses, no uterine or adnexal tenderness. Exam suboptimal 2/2 body habitus.  Performed in the presence of a chaperone.   Assessment and Plan:    1. Women's annual routine gynecological examination Routine annual exam  - Cervicovaginal ancillary only( [place]) - Hepatitis B surface antigen - Hepatitis C antibody - HIV Antibody (routine testing w rflx) - Cytology - PAP - RPR  2. Routine screening for STI (sexually transmitted infection) Per pt request - Cervicovaginal ancillary only( [place]) - Hepatitis B surface antigen - Hepatitis C antibody - HIV Antibody (routine testing w rflx) - Cytology - PAP - RPR  3. DUB (dysfunctional uterine bleeding) Endometrial biopsy taken, see separate note If biopsy normal can place  progesterone IUD, pt desires to restart depo-provera  - POCT urine pregnancy - Surgical pathology( / [facility])  4. Morbid obesity with BMI of 50.0-59.9, adult (HCC) Pt s/p gastric sleeve, still emphasize diet and exercise for weight loss.   Will follow up results of pap smear and manage accordingly. Routine preventative health maintenance measures emphasized. Please refer to After Visit Summary for other counseling recommendations.      05-16-1994, MD, FACOG Obstetrician & Gynecologist, [facility] for [facility], [facility]

## 2021-08-07 NOTE — Progress Notes (Signed)
ENDOMETRIAL BIOPSY      Shannon Huffman is a 36 y.o. G0P0000 here for endometrial biopsy.  The indications for endometrial biopsy were reviewed.  Risks of the biopsy including cramping, bleeding, infection, uterine perforation, inadequate specimen and need for additional procedures were discussed. The patient states she understands and agrees to undergo procedure today. Consent was signed. Time out was performed.   Indications: menorrhagia , morbid obesity Urine HCG: neg  A bivalve speculum was placed into the vagina and the cervix was easily visualized and was prepped with Betadine x2. A single-toothed tenaculum was placed on the anterior lip of the cervix to stabilize it. The 3 mm pipelle was introduced into the endometrial cavity without difficulty to a depth of 10 cm, and a moderate amount of tissue was obtained and sent to pathology. This was repeated for a total of 2 passes. The instruments were removed from the patient's vagina. Minimal bleeding from the cervix at the tenaculum was noted.   The patient tolerated the procedure well. Routine post-procedure instructions were given to the patient.    Will base further management on results of biopsy.  Mariel Aloe, MD

## 2021-08-08 LAB — RPR: RPR Ser Ql: NONREACTIVE

## 2021-08-08 LAB — HEPATITIS C ANTIBODY: Hep C Virus Ab: NONREACTIVE

## 2021-08-08 LAB — HEPATITIS B SURFACE ANTIGEN: Hepatitis B Surface Ag: NEGATIVE

## 2021-08-08 LAB — HIV ANTIBODY (ROUTINE TESTING W REFLEX): HIV Screen 4th Generation wRfx: NONREACTIVE

## 2021-08-10 LAB — CERVICOVAGINAL ANCILLARY ONLY
Bacterial Vaginitis (gardnerella): NEGATIVE
Candida Glabrata: NEGATIVE
Candida Vaginitis: NEGATIVE
Chlamydia: NEGATIVE
Comment: NEGATIVE
Comment: NEGATIVE
Comment: NEGATIVE
Comment: NEGATIVE
Comment: NEGATIVE
Comment: NORMAL
Neisseria Gonorrhea: NEGATIVE
Trichomonas: NEGATIVE

## 2021-08-10 LAB — SURGICAL PATHOLOGY

## 2021-08-14 LAB — CYTOLOGY - PAP
Comment: NEGATIVE
Comment: NEGATIVE
Comment: NEGATIVE
HPV 16: NEGATIVE
HPV 18 / 45: NEGATIVE
High risk HPV: POSITIVE — AB

## 2021-10-05 ENCOUNTER — Encounter: Payer: Self-pay | Admitting: Obstetrics and Gynecology

## 2021-10-05 ENCOUNTER — Other Ambulatory Visit (HOSPITAL_COMMUNITY)
Admission: RE | Admit: 2021-10-05 | Discharge: 2021-10-05 | Disposition: A | Discharge status: Home / Self Care | Payer: Medicaid Other | Source: Ambulatory Visit | Attending: Obstetrics and Gynecology | Admitting: Obstetrics and Gynecology

## 2021-10-05 ENCOUNTER — Ambulatory Visit (INDEPENDENT_AMBULATORY_CARE_PROVIDER_SITE_OTHER): Payer: Medicaid Other | Admitting: Obstetrics and Gynecology

## 2021-10-05 ENCOUNTER — Other Ambulatory Visit: Payer: Self-pay

## 2021-10-05 VITALS — BP 144/97 | HR 82 | Ht 66.0 in | Wt 359.4 lb

## 2021-10-05 DIAGNOSIS — R87612 Low grade squamous intraepithelial lesion on cytologic smear of cervix (LGSIL): Secondary | ICD-10-CM | POA: Diagnosis present

## 2021-10-05 DIAGNOSIS — Z3202 Encounter for pregnancy test, result negative: Secondary | ICD-10-CM

## 2021-10-05 DIAGNOSIS — N870 Mild cervical dysplasia: Secondary | ICD-10-CM | POA: Diagnosis not present

## 2021-10-05 DIAGNOSIS — Z6841 Body Mass Index (BMI) 40.0 and over, adult: Secondary | ICD-10-CM

## 2021-10-05 LAB — POCT PREGNANCY, URINE: Preg Test, Ur: NEGATIVE

## 2021-10-05 NOTE — Progress Notes (Signed)
Patient given informed consent, signed copy in the chart, time out was performed.  UPT negative.  Pap reviewed and CIN 1 noted.  Placed in lithotomy position. Cervix viewed with speculum and colposcope after application of acetic acid.   Colposcopy adequate?  no [place]et[place]hite lesions? none Punctation? none Mosaicism?   none Abnormal vasculature?  none Biopsies? none ECC? yes  COMMENTS: Patient was given post procedure instructions.   Griffin Basil, MD

## 2021-10-07 LAB — SURGICAL PATHOLOGY

## 2021-11-04 ENCOUNTER — Ambulatory Visit (INDEPENDENT_AMBULATORY_CARE_PROVIDER_SITE_OTHER): Payer: Medicaid Other | Admitting: General Practice

## 2021-11-04 DIAGNOSIS — Z3042 Encounter for surveillance of injectable contraceptive: Secondary | ICD-10-CM | POA: Diagnosis not present

## 2021-11-04 MED ORDER — MEDROXYPROGESTERONE ACETATE 150 MG/ML IM SUSP: 150.0000 mg | INTRAMUSCULAR | 10 refills | Status: DC

## 2021-11-04 MED ORDER — MEDROXYPROGESTERONE ACETATE 150 MG/ML IM SUSP
150.0000 mg | INTRAMUSCULAR | Status: AC
  Administered 2021-11-04: 150 mg via INTRAMUSCULAR

## 2021-11-04 MED ORDER — MEDROXYPROGESTERONE ACETATE 150 MG/ML IM SUSP: 150.0000 mg | INTRAMUSCULAR | 3 refills | Status: DC

## 2021-11-04 NOTE — Progress Notes (Signed)
Date last pap: 08-07-21. Last Depo-Provera: 08-07-21. Side Effects if any: Pt tolerated well. Serum HCG indicated? Depo given on schedule. Depo-Provera 150 mg IM given by: Arlie Solomons, CMA given in the RUOQ per pt request. Next appointment due 1/3-1/17.   Office supply used. Refills sent to pharmacy.

## 2021-11-04 NOTE — Progress Notes (Signed)
Agree with nurses's documentation of this patient's clinic encounter.  Jezreel Justiniano L, MD  

## 2021-11-27 ENCOUNTER — Other Ambulatory Visit (HOSPITAL_COMMUNITY): Payer: Self-pay | Admitting: Internal Medicine

## 2021-11-27 ENCOUNTER — Ambulatory Visit (HOSPITAL_COMMUNITY)
Admission: RE | Admit: 2021-11-27 | Discharge: 2021-11-27 | Disposition: A | Discharge status: Home / Self Care | Payer: Medicaid Other | Source: Ambulatory Visit | Attending: Internal Medicine | Admitting: Internal Medicine

## 2021-11-27 DIAGNOSIS — R0602 Shortness of breath: Secondary | ICD-10-CM | POA: Insufficient documentation

## 2022-01-28 ENCOUNTER — Ambulatory Visit: Payer: Medicaid Other

## 2022-03-16 ENCOUNTER — Ambulatory Visit: Payer: Medicaid Other

## 2022-04-10 ENCOUNTER — Emergency Department (HOSPITAL_COMMUNITY): Payer: Medicaid Other

## 2022-04-10 ENCOUNTER — Emergency Department (HOSPITAL_COMMUNITY)
Admission: EM | Admit: 2022-04-10 | Discharge: 2022-04-10 | Disposition: A | Discharge status: Home / Self Care | Payer: Medicaid Other | Attending: Emergency Medicine | Admitting: Emergency Medicine

## 2022-04-10 ENCOUNTER — Other Ambulatory Visit: Payer: Self-pay

## 2022-04-10 DIAGNOSIS — Z91040 Latex allergy status: Secondary | ICD-10-CM | POA: Insufficient documentation

## 2022-04-10 DIAGNOSIS — R109 Unspecified abdominal pain: Secondary | ICD-10-CM | POA: Diagnosis present

## 2022-04-10 LAB — CBC WITH DIFFERENTIAL/PLATELET
Abs Immature Granulocytes: 0.01 10*3/uL (ref 0.00–0.07)
Basophils Absolute: 0 10*3/uL (ref 0.0–0.1)
Basophils Relative: 0 %
Eosinophils Absolute: 0.1 10*3/uL (ref 0.0–0.5)
Eosinophils Relative: 1 %
HCT: 36.3 % (ref 36.0–46.0)
Hemoglobin: 10.9 g/dL — ABNORMAL LOW (ref 12.0–15.0)
Immature Granulocytes: 0 %
Lymphocytes Relative: 27 %
Lymphs Abs: 1.4 10*3/uL (ref 0.7–4.0)
MCH: 21.1 pg — ABNORMAL LOW (ref 26.0–34.0)
MCHC: 30 g/dL (ref 30.0–36.0)
MCV: 70.3 fL — ABNORMAL LOW (ref 80.0–100.0)
Monocytes Absolute: 0.5 10*3/uL (ref 0.1–1.0)
Monocytes Relative: 10 %
Neutro Abs: 3.2 10*3/uL (ref 1.7–7.7)
Neutrophils Relative %: 62 %
Platelets: 241 10*3/uL (ref 150–400)
RBC: 5.16 MIL/uL — ABNORMAL HIGH (ref 3.87–5.11)
RDW: 22 % — ABNORMAL HIGH (ref 11.5–15.5)
WBC: 5.1 10*3/uL (ref 4.0–10.5)
nRBC: 0 % (ref 0.0–0.2)

## 2022-04-10 LAB — COMPREHENSIVE METABOLIC PANEL
ALT: 12 U/L (ref 0–44)
AST: 15 U/L (ref 15–41)
Albumin: 3.5 g/dL (ref 3.5–5.0)
Alkaline Phosphatase: 69 U/L (ref 38–126)
Anion gap: 6 (ref 5–15)
BUN: 7 mg/dL (ref 6–20)
CO2: 25 mmol/L (ref 22–32)
Calcium: 8.7 mg/dL — ABNORMAL LOW (ref 8.9–10.3)
Chloride: 107 mmol/L (ref 98–111)
Creatinine, Ser: 0.73 mg/dL (ref 0.44–1.00)
GFR, Estimated: >60 mL/min (ref >60)
Glucose, Bld: 94 mg/dL (ref 70–99)
Potassium: 3.6 mmol/L (ref 3.5–5.1)
Sodium: 138 mmol/L (ref 135–145)
Total Bilirubin: 0.8 mg/dL (ref 0.3–1.2)
Total Protein: 6.8 g/dL (ref 6.5–8.1)

## 2022-04-10 LAB — URINALYSIS, ROUTINE W REFLEX MICROSCOPIC
Bilirubin Urine: NEGATIVE
Glucose, UA: NEGATIVE mg/dL
Hgb urine dipstick: NEGATIVE
Ketones, ur: NEGATIVE mg/dL
Leukocytes,Ua: NEGATIVE
Nitrite: NEGATIVE
Protein, ur: NEGATIVE mg/dL
Specific Gravity, Urine: 1.017 (ref 1.005–1.030)
pH: 6 (ref 5.0–8.0)

## 2022-04-10 LAB — PREGNANCY, URINE: Preg Test, Ur: NEGATIVE

## 2022-04-10 LAB — LIPASE, BLOOD: Lipase: 23 U/L (ref 11–51)

## 2022-04-10 MED ORDER — IOHEXOL 300 MG/ML  SOLN
100.0000 mL | Freq: Once | INTRAMUSCULAR | Status: AC | PRN
  Administered 2022-04-10: 100 mL via INTRAVENOUS

## 2022-04-10 NOTE — ED Provider Notes (Signed)
[place] EMERGENCY DEPARTMENT AT [facility] Provider Note   CSN: [id number] Arrival date & time: 04/10/22  1049     History  Chief Complaint  Patient presents with   Flank Pain    Shannon Huffman is a 37 y.o. female.  Presents to the ED with intermittent right flank pain for the past 3 weeks.  Pain comes and goes and appears to be "worse when it rains".  Pain is in her right flank without any radiation down her buttock or leg.  Denies any abdominal pain.  Denies any pain with urination or blood in the urine.  No vaginal bleeding or discharge.  No history of kidney stones.  Still has appendix and gallbladder.  Did have weight loss surgery with gastric bypass 2 months ago in [place]. No chest pain or shortness of breath.  She takes oxycodone chronically for pain in her knees and she is taking that for the side pain as well.  Not having any pain currently.  The history is provided by the patient.  Flank Pain Pertinent negatives include no chest pain, no abdominal pain, no headaches and no shortness of breath.       Home Medications Prior to Admission medications   Medication Sig Start Date End Date Taking? Authorizing Provider  albuterol (PROVENTIL HFA;VENTOLIN HFA) 108 (90 BASE) MCG/ACT inhaler Inhale 2 puffs into the lungs every 6 (six) hours as needed for wheezing or shortness of breath.     [provider]  atenolol (TENORMIN) 50 MG tablet Take 50 mg by mouth daily.    [provider]  cholecalciferol (VITAMIN D) 1000 units tablet Take 1,000 Units by mouth daily.    [provider]  doxycycline (VIBRAMYCIN) 100 MG capsule Take 1 capsule (100 mg total) by mouth 2 (two) times daily. 11/26/20   Shelly Bombard, MD  folic acid (FOLVITE) 1 MG tablet Take 1 mg by mouth daily. Patient not taking: Reported on 08/07/2021    [provider]  hydrochlorothiazide (HYDRODIURIL) 25 MG tablet Take 25 mg by mouth daily.    [provider]  medroxyPROGESTERone (DEPO-PROVERA) 150 MG/ML injection Inject 1 mL (150 mg total) into the muscle every 3 (three) months. 11/04/21   Chancy Milroy, MD  metroNIDAZOLE (FLAGYL) 500 MG tablet Take 1 tablet (500 mg total) by mouth 2 (two) times daily. Patient not taking: Reported on 08/07/2021 11/26/20   Shelly Bombard, MD  mometasone (NASONEX) 50 MCG/ACT nasal spray Place into the nose.    [provider]  norethindrone-ethinyl estradiol 1/35 ([place] 1/35, 28,) tablet Take 1 active tab po every 8 hours with 1st pack, then 1 tab po daily.  Dispense 2 packs with 1st prescription pick-up. Patient not taking: Reported on 08/07/2021 11/26/20   Shelly Bombard, MD  oxyCODONE-acetaminophen (PERCOCET) 10-325 MG tablet Take 1 tablet by mouth 3 (three) times daily.    [provider]  phentermine (ADIPEX-P) 37.5 MG tablet Take 37.5 mg by mouth every morning. 12/14/16   [provider]  traMADol (ULTRAM) 50 MG tablet Take by mouth every 6 (six) hours as needed.    [provider]      Allergies    Adhesive [tape], Clindamycin/lincomycin, and Latex    Review of Systems   Review of Systems  Constitutional:  Negative for activity change, appetite change and fever.  HENT:  Negative for congestion.   Respiratory:  Negative for cough, chest tightness and shortness of breath.  Cardiovascular:  Negative for chest pain.  Gastrointestinal:  Negative for abdominal pain, nausea and vomiting.  Genitourinary:  Positive for flank pain. Negative for vaginal bleeding and vaginal discharge.  Musculoskeletal:  Positive for arthralgias and myalgias. Negative for back pain.  Skin:  Negative for rash.  Neurological:  Negative for dizziness, weakness and headaches.   all other systems are negative except as noted in the HPI and PMH.    Physical Exam Updated Vital Signs BP (!) 163/94 (BP Location: Left Arm)   Pulse 95   Temp 98.2 F (36.8 C) (Oral)   Resp 16    Ht 5\' 6"  (1.676 m)   Wt (!) 166 kg   SpO2 96%   BMI 59.07 kg/m  Physical Exam Vitals and nursing note reviewed.  Constitutional:      General: She is not in acute distress.    Appearance: She is well-developed. She is obese.     Comments: Texting on phone, no distress  HENT:     Head: Normocephalic and atraumatic.     Mouth/Throat:     Pharynx: No oropharyngeal exudate.  Eyes:     Conjunctiva/sclera: Conjunctivae normal.     Pupils: Pupils are equal, round, and reactive to light.  Neck:     Comments: No meningismus. Cardiovascular:     Rate and Rhythm: Normal rate and regular rhythm.     Heart sounds: Normal heart sounds. No murmur heard. Pulmonary:     Effort: Pulmonary effort is normal. No respiratory distress.     Breath sounds: Normal breath sounds.  Abdominal:     Palpations: Abdomen is soft.     Tenderness: There is no abdominal tenderness. There is no guarding or rebound.     Comments: No right upper quadrant tenderness  Musculoskeletal:        General: Tenderness present. Normal range of motion.     Cervical back: Normal range of motion and neck supple.     Comments: Right CVA tenderness  5/5 strength in bilateral lower extremities. Ankle plantar and dorsiflexion intact. Great toe extension intact bilaterally. +2 DP and PT pulses.  Skin:    General: Skin is warm.  Neurological:     Mental Status: She is alert and oriented to person, place, and time.     Cranial Nerves: No cranial nerve deficit.     Motor: No abnormal muscle tone.     Coordination: Coordination normal.     Comments:  5/5 strength throughout. CN 2-12 intact.Equal grip strength.   Psychiatric:        Behavior: Behavior normal.     ED Results / Procedures / Treatments   Labs (all labs ordered are listed, but only abnormal results are displayed) Labs Reviewed  CBC WITH DIFFERENTIAL/PLATELET - Abnormal; Notable for the following components:      Result Value   RBC 5.16 (*)    Hemoglobin  10.9 (*)    MCV 70.3 (*)    MCH 21.1 (*)    RDW 22.0 (*)    All other components within normal limits  COMPREHENSIVE METABOLIC PANEL - Abnormal; Notable for the following components:   Calcium 8.7 (*)    All other components within normal limits  LIPASE, BLOOD  URINALYSIS, ROUTINE W REFLEX MICROSCOPIC  PREGNANCY, URINE    EKG None  Radiology US Abdomen Complete  Result Date: 04/10/2022 CLINICAL DATA:  "Fluid collection" on CT. EXAM: ABDOMEN ULTRASOUND COMPLETE COMPARISON:  None Available. FINDINGS: Gallbladder: No gallstones or wall thickening  visualized. No sonographic Murphy sign noted by sonographer. Common bile duct: Diameter: 0.4 cm. Liver: No focal lesion identified. Within normal limits in parenchymal echogenicity. Portal vein is patent on color Doppler imaging with normal direction of blood flow towards the liver. Hepatopetal portal vein. IVC: No abnormality visualized. Pancreas: Visualized portion unremarkable. Spleen: Size and appearance within normal limits. Right Kidney: Length: 10.8 cm. Echogenicity within normal limits. No mass or hydronephrosis visualized. Left Kidney: Length: 11.2 cm. Echogenicity within normal limits. No mass or hydronephrosis visualized. Abdominal aorta: No aneurysm visualized. Other findings: None.  No "fluid collection." IMPRESSION: Unremarkable examination of the abdomen. Electronically Signed   By: Sammie Bench M.D.   On: 04/10/2022 15:16   CT ABDOMEN PELVIS W CONTRAST  Result Date: 04/10/2022 CLINICAL DATA:  Abdominal pain and right flank pain. Recent gastric bypass. EXAM: CT ABDOMEN AND PELVIS WITH CONTRAST TECHNIQUE: Multidetector CT imaging of the abdomen and pelvis was performed using the standard protocol following bolus administration of intravenous contrast. RADIATION DOSE REDUCTION: This exam was performed according to the departmental dose-optimization program which includes automated exposure control, adjustment of the mA and/or kV  according to patient size and/or use of iterative reconstruction technique. CONTRAST:  177mL OMNIPAQUE IOHEXOL 300 MG/ML  SOLN COMPARISON:  None Available. FINDINGS: Lower chest: Unremarkable Hepatobiliary: Mild hepatic steatosis in segment 4 of the liver adjacent to falciform ligament. 3.5 by 1.7 by 1.7 cm hypodensity along the inferior margin of the left hepatic lobe adjacent to the splenic hilum on image 46 series 4, internal density 44 Hounsfield units, this could represent a small amount of complex fluid associated with the liver or spleen, versus a small exophytic lesion, postoperative fluid collection associated with the gastric bypass, or duplication cyst. Pancreas: Unremarkable Spleen: Aside from the hypodensity at the junction of the liver and spleen noted above, the spleen appears normal. Adrenals/Urinary Tract: 2 mm right kidney upper pole nonobstructive renal calculus. Stomach/Bowel: Prior gastric bypass. Vascular/Lymphatic: Borderline prominence of nodal tissues along the mesentery. Reproductive: Unremarkable Other: No supplemental non-categorized findings. Musculoskeletal: Lower lumbar spondylosis and degenerative disc disease causing bilateral foraminal impingement at L4-5 and to a lesser degree at L5-S1. IMPRESSION: 1. A specific cause for the patient's right flank pain is not identified. 2. 2 mm right kidney upper pole nonobstructive renal calculus. 3. Prior gastric bypass. 4. 3.5 by 1.7 by 1.7 cm hypodensity along the inferior margin of the left hepatic lobe adjacent to the splenic hilum. This could represent a small amount of complex fluid associated with the liver or spleen, versus a small exophytic lesion, postoperative fluid collection associated with remote gastric bypass, or duplication cyst. 5. Borderline prominence of nodal tissues along the mesentery, nonspecific. 6. Lower lumbar spondylosis and degenerative disc disease causing bilateral foraminal impingement at L4-5 and to a lesser  degree at L5-S1. 7. Mild hepatic steatosis in segment 4 of the liver adjacent to the falciform ligament. Electronically Signed   By: Van Clines M.D.   On: 04/10/2022 13:27    Procedures Procedures    Medications Ordered in ED Medications - No data to display  ED Course/ Medical Decision Making/ A&P                             Medical Decision Making Amount and/or Complexity of Data Reviewed Labs: ordered. Decision-making details documented in ED Course. Radiology: ordered and independent interpretation performed. Decision-making details documented in ED Course. ECG/medicine tests: ordered and  independent interpretation performed. Decision-making details documented in ED Course.   Intermittent right flank pain for the past 3 weeks, no fall or injury.  Vital stable, no distress.  Intact distal strength, sensation and pulses.  Low suspicion for cord compression or cauda equina  Consider UTI, consider kidney stone, consider gallbladder pathology.  Will also need to obtain CT imaging to evaluate for complication from her recent gastric bypass surgery.  Urinalysis is negative.  No hematuria.  LFTs and lipase are stable.  Stable anemia.  Recent gastric bypass surgery, CT scan is obtained.  This shows no explanation for her right flank pain.  No gallstones visualized.  Does have kidney stone on the right but no ureteral stones.  Consider passed kidney stone.  She does have a fluid collection to the left lobe of her liver approaching the spleen of uncertain etiology.  Discussed with Dr. Janeece Fitting of radiology.  Uncertain etiology of this fluid collection which is between liver and spleen.  This is not in the area of her pain.  May represent postoperative seroma or fluid collection or hematoma.  Not consistent with mass.  Could proceed with ultrasound for further investigation to identify fluid collection.  Ultrasound obtained and shows no redemonstration of this fluid  collection.  Patient to be referred back to her surgeon for further evaluation.  Suspect her flank pain likely secondary to passed kidney stone. Return to the ED with worsening pain, fever, vomiting, unable to urinate or any concerns       Final Clinical Impression(s) / ED Diagnoses Final diagnoses:  Right flank pain    Rx / DC Orders ED Discharge Orders     None         Tyde Lamison, Annie Main, MD 04/10/22 1544

## 2022-04-10 NOTE — Discharge Instructions (Addendum)
As we discussed you may passed a kidney stone and this may be causing her flank pain.  You do have this fluid collection between your liver and spleen which is uncertain may be related to your previous gastric bypass surgery.  It does not appear to be responsible for your pain.  You should follow-up with your surgeon about to have a follow-up to make sure it does not worsen in size.  Return to the ED with difficulty with urination, worsening pain, fever, vomiting, not able to eat or drink or other concerns

## 2022-04-10 NOTE — ED Triage Notes (Signed)
Pt reports right flank "on and off" for 3 weeks

## 2022-07-29 ENCOUNTER — Ambulatory Visit (INDEPENDENT_AMBULATORY_CARE_PROVIDER_SITE_OTHER): Payer: Medicaid Other | Admitting: General Practice

## 2022-07-29 ENCOUNTER — Other Ambulatory Visit (HOSPITAL_COMMUNITY): Admission: RE | Admit: 2022-07-29 | Payer: Medicaid Other | Source: Ambulatory Visit

## 2022-07-29 VITALS — BP 133/91 | HR 79 | Ht 66.0 in | Wt 327.8 lb

## 2022-07-29 DIAGNOSIS — Z113 Encounter for screening for infections with a predominantly sexual mode of transmission: Secondary | ICD-10-CM

## 2022-07-29 DIAGNOSIS — Z3202 Encounter for pregnancy test, result negative: Secondary | ICD-10-CM | POA: Diagnosis not present

## 2022-07-29 LAB — POCT URINE PREGNANCY: Preg Test, Ur: NEGATIVE

## 2022-07-29 NOTE — Progress Notes (Signed)
SUBJECTIVE:  37 y.o. female presents for routine STD testing and UPT. Denies abnormal vaginal bleeding or significant pelvic pain or fever. No UTI symptoms. Denies history of known exposure to STD.  No LMP recorded.  OBJECTIVE:  She appears well, afebrile. Urine dipstick: not done.  ASSESSMENT:  Vaginal Discharge  Vaginal Odor   PLAN:  GC, chlamydia, trichomonas, BVAG, CVAG probe sent to lab. Negative UPT  Treatment: To be determined once lab results are received ROV prn if symptoms persist or worsen.

## 2022-07-30 ENCOUNTER — Other Ambulatory Visit: Payer: Self-pay | Admitting: Student

## 2022-07-30 DIAGNOSIS — B3731 Acute candidiasis of vulva and vagina: Secondary | ICD-10-CM

## 2022-07-30 LAB — HEPATITIS C ANTIBODY: Hep C Virus Ab: NONREACTIVE

## 2022-07-30 LAB — CERVICOVAGINAL ANCILLARY ONLY
Bacterial Vaginitis (gardnerella): NEGATIVE
Candida Glabrata: NEGATIVE
Candida Vaginitis: POSITIVE — AB
Chlamydia: NEGATIVE
Comment: NEGATIVE
Comment: NEGATIVE
Comment: NEGATIVE
Comment: NEGATIVE
Comment: NEGATIVE
Comment: NORMAL
Neisseria Gonorrhea: NEGATIVE
Trichomonas: NEGATIVE

## 2022-07-30 LAB — HEPATITIS B SURFACE ANTIGEN: Hepatitis B Surface Ag: NEGATIVE

## 2022-07-30 LAB — RPR: RPR Ser Ql: NONREACTIVE

## 2022-07-30 LAB — HIV ANTIBODY (ROUTINE TESTING W REFLEX): HIV Screen 4th Generation wRfx: NONREACTIVE

## 2022-07-30 MED ORDER — FLUCONAZOLE 150 MG PO TABS: 150.0000 mg | ORAL_TABLET | ORAL | 0 refills | Status: AC

## 2022-08-05 ENCOUNTER — Ambulatory Visit: Payer: Medicaid Other

## 2022-08-05 IMAGING — US US PELVIS COMPLETE WITH TRANSVAGINAL
1 series · 15 of 25 positions shown · non-contrast
Comparison: 11/19/2015

CLINICAL DATA: Dysfunctional uterine bleeding

EXAM:
TRANSABDOMINAL AND TRANSVAGINAL ULTRASOUND OF PELVIS
TECHNIQUE: Both transabdominal and transvaginal ultrasound examinations of the
pelvis were performed. Transabdominal technique was performed for
global imaging of the pelvis including uterus, ovaries, adnexal
regions, and pelvic cul-de-sac. It was necessary to proceed with
endovaginal exam following the transabdominal exam to visualize the
ovaries bilaterally.

[Series 1: us pelvis complete with transvaginal · 15 of 117 slices shown]
[im 1/117]
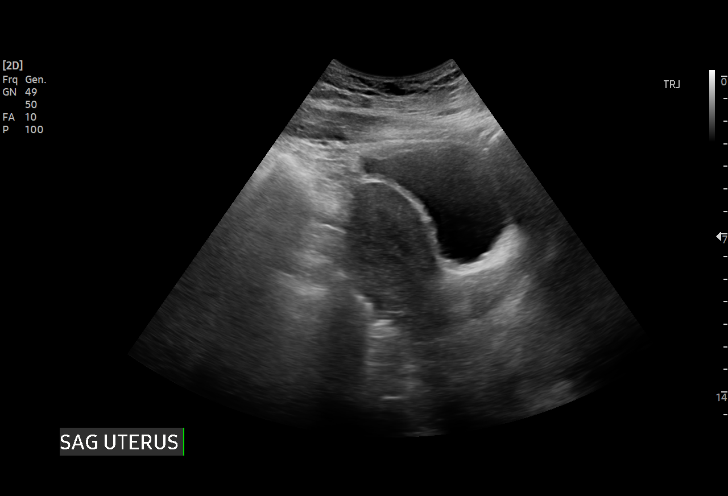
[im 10/117]
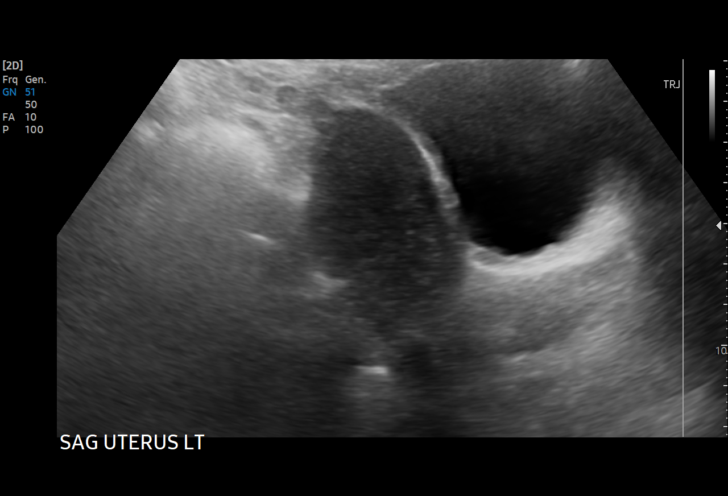
[im 20/117]
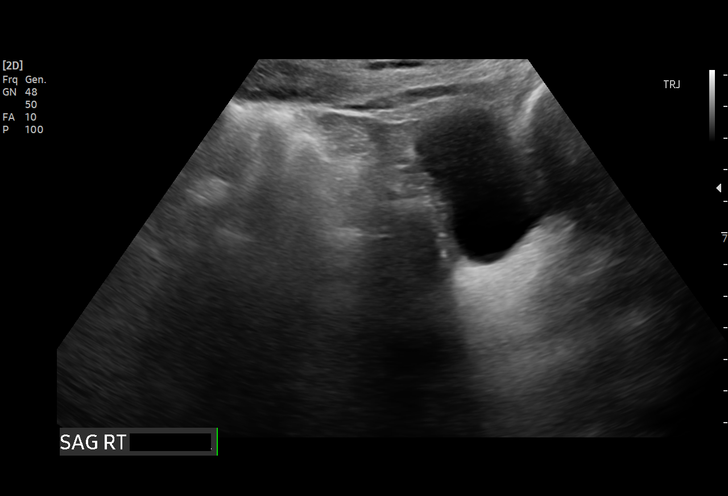
[im 25/117]
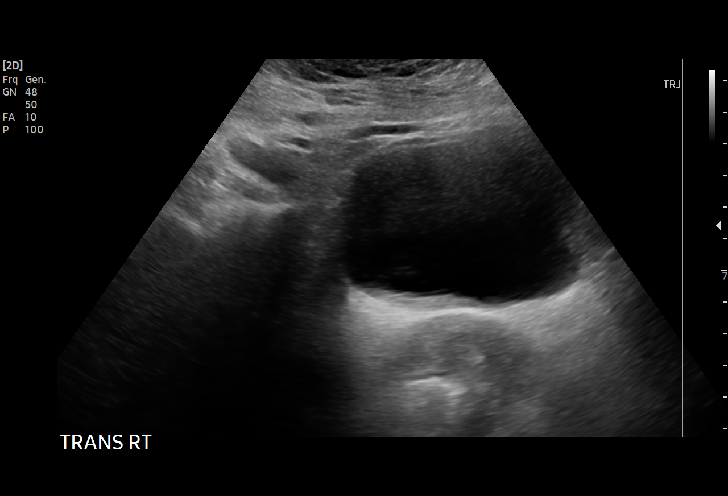
[im 34/117]
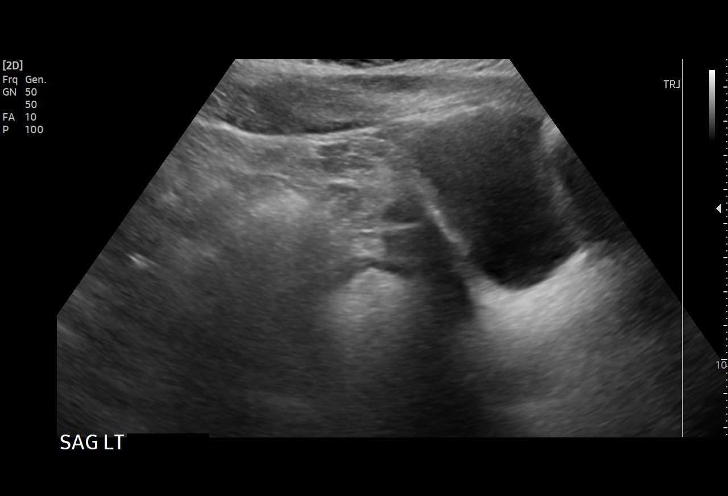
[im 44/117]
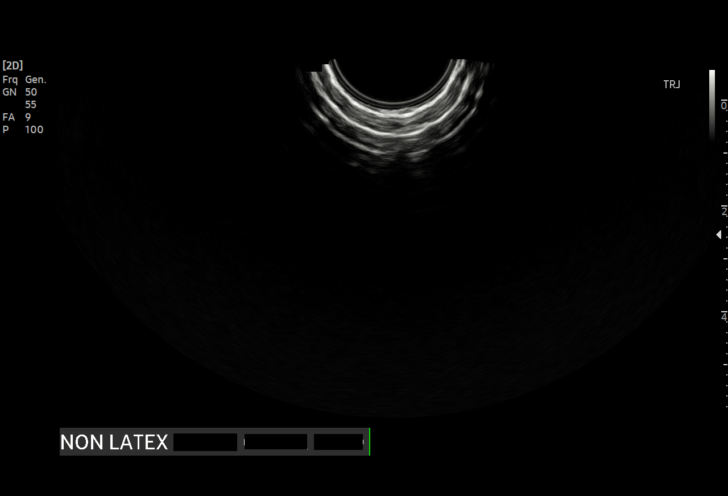
[im 49/117]
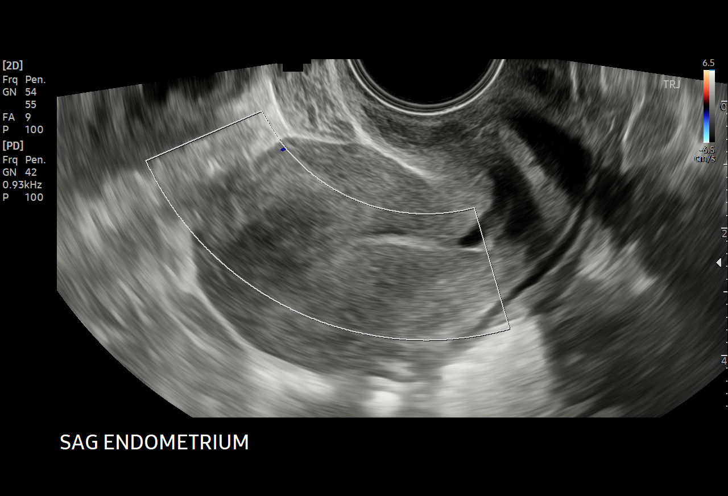
[im 59/117]
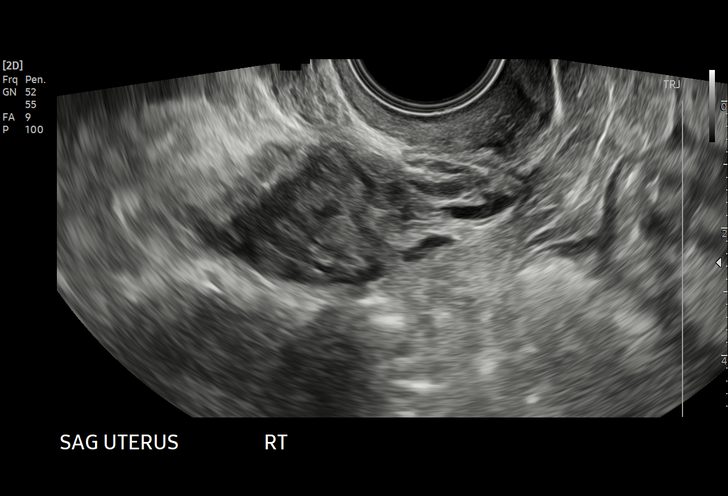
[im 68/117]
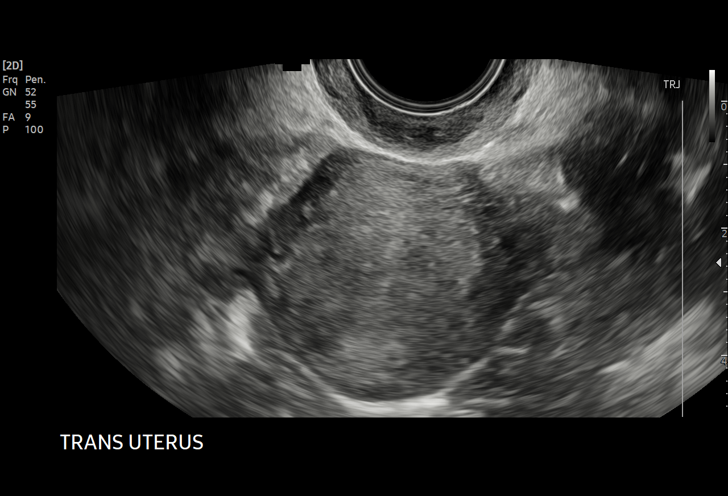
[im 73/117]
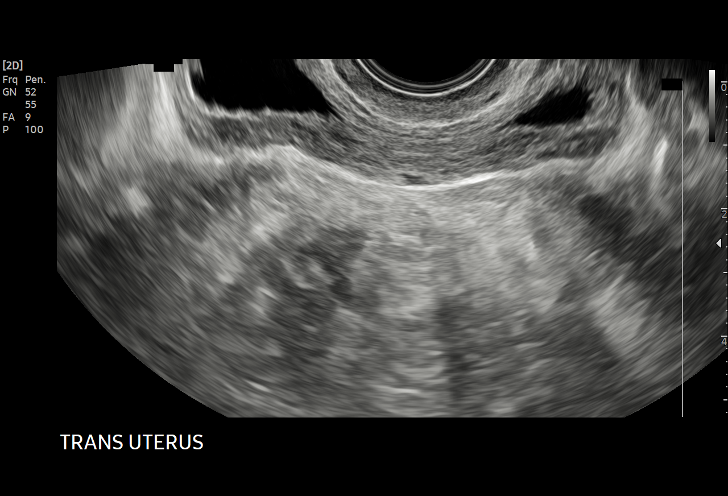
[im 83/117]
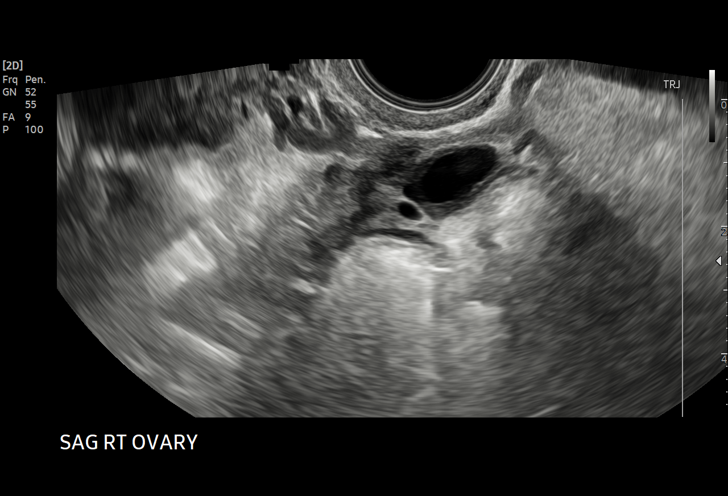
[im 92/117]
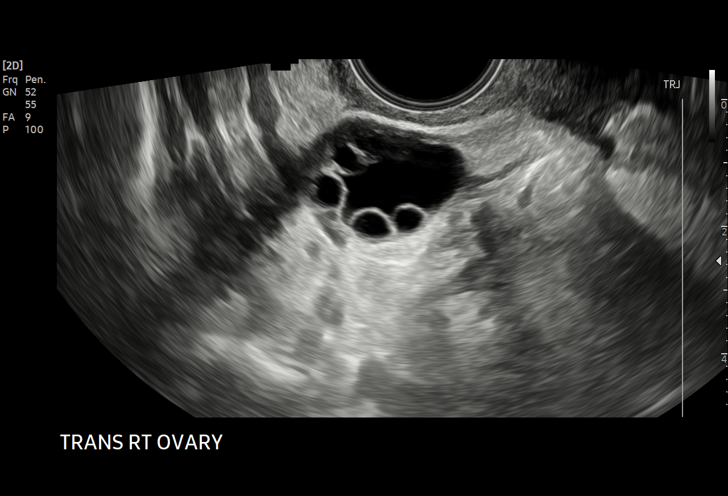
[im 97/117]
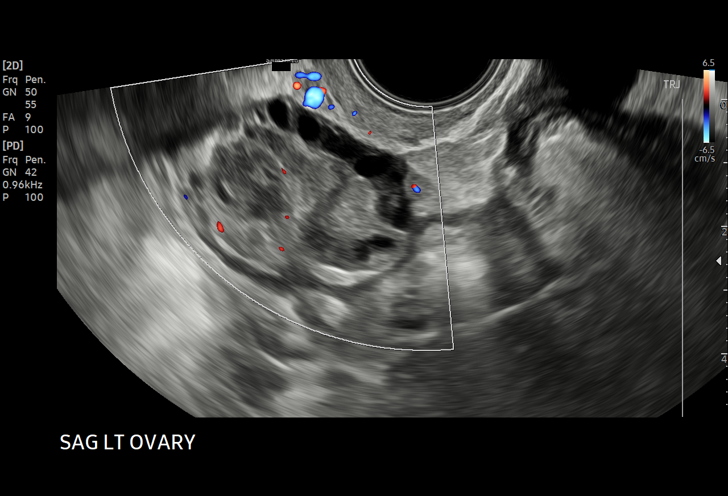
[im 107/117]
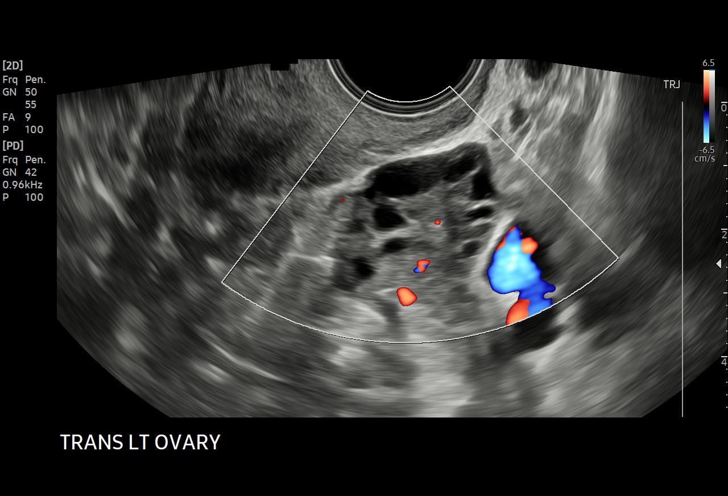
[im 117/117]
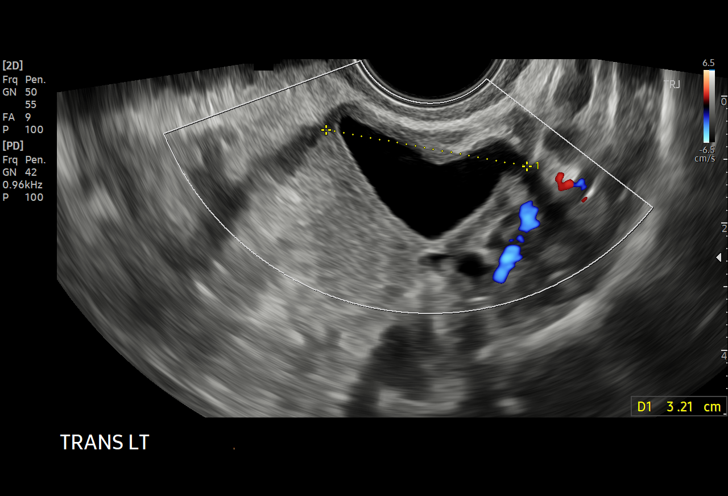

[15 of 25 positions shown; findings below may reference images not displayed]

FINDINGS: Uterus

Measurements: 6.9 x 3.6 x 4.0 cm = volume: 52 mL. The uterus is
anteverted. No intrauterine masses are identified. The cervix is
unremarkable.

Endometrium

Thickness: 3 mm.  No focal abnormality visualized.

Right ovary

Measurements: 4.4 x 3.0 x 3.5 cm = volume: 24 mL. There are numerous
(greater than 12) subcentimeter follicles noted subserosally
throughout the right ovary.

Left ovary

Measurements: 3.5 x 2.4 x 3.2 cm = volume: 14 mL. There are numerous
(greater than 12) subcentimeter follicles noted subserosally
throughout the left ovary

Other findings

Trace simple appearing free fluid is seen within the pelvis
IMPRESSION: Innumerable subserosal follicles. These findings would support a
clinical diagnosis of polycystic ovary syndrome and laboratory
correlation may be helpful for further evaluation.

## 2022-09-03 NOTE — Progress Notes (Signed)
Patient was assessed and managed by nursing staff during this encounter. I have reviewed the chart and agree with the documentation and plan. I have also made any necessary editorial changes.  Warden Fillers, MD 09/03/2022 4:36 PM

## 2022-10-19 ENCOUNTER — Ambulatory Visit: Payer: Medicaid Other | Admitting: Plastic Surgery

## 2022-10-19 ENCOUNTER — Encounter: Payer: Self-pay | Admitting: Plastic Surgery

## 2022-10-19 VITALS — BP 130/80 | Ht 66.0 in | Wt 314.6 lb

## 2022-10-19 DIAGNOSIS — M793 Panniculitis, unspecified: Secondary | ICD-10-CM | POA: Diagnosis not present

## 2022-10-19 DIAGNOSIS — M542 Cervicalgia: Secondary | ICD-10-CM

## 2022-10-19 DIAGNOSIS — R21 Rash and other nonspecific skin eruption: Secondary | ICD-10-CM | POA: Diagnosis not present

## 2022-10-19 DIAGNOSIS — M549 Dorsalgia, unspecified: Secondary | ICD-10-CM

## 2022-10-19 DIAGNOSIS — Z9884 Bariatric surgery status: Secondary | ICD-10-CM

## 2022-10-19 NOTE — Progress Notes (Signed)
Patient ID: Shannon Huffman, female    DOB: October 01, 1985, 37 y.o.   MRN: [id number]   Chief Complaint  Patient presents with   Advice Only   Skin Problem    The patient is a 37 year old female here for evaluation of her abdomen.  She underwent a gastric bypass for the second time in January.  She went from 467 pounds to 315 pounds.  Since January she lost 70 additional pounds.  She is 5 feet 6 inches tall.  She complains of neck and back pain due to her pannus.  She has rashes and skin breakdown.  She has tried creams to try and help alleviate the skin breakdown and the odor.  With increased weight loss it seems to get worse over time.  No sign of a hernia.  She has had her tonsils and wisdom teeth removed in the past.  No other surgery.  He does have a seizure disorder, asthma and hypertension.    Review of Systems  Constitutional:  Positive for activity change. Negative for appetite change.  HENT: Negative.    Eyes: Negative.   Respiratory: Negative.  Negative for chest tightness.   Cardiovascular: Negative.   Gastrointestinal: Negative.   Endocrine: Negative.   Genitourinary: Negative.   Musculoskeletal:  Positive for back pain and neck pain.  Skin:  Positive for rash.    Past Medical History:  Diagnosis Date   Asthma    Epilepsy (HCC)    Hypertension 06/2016   Morbid obesity (HCC)    Seizures (HCC)    Sleep apnea     Past Surgical History:  Procedure Laterality Date   LAPAROSCOPIC GASTRIC SLEEVE RESECTION  10/08/2019   right knee surgery Right 01/19/2012   TONSILLECTOMY AND ADENOIDECTOMY  childhood      Current Outpatient Medications:    albuterol (PROVENTIL HFA;VENTOLIN HFA) 108 (90 BASE) MCG/ACT inhaler, Inhale 2 puffs into the lungs every 6 (six) hours as needed for wheezing or shortness of breath. , Disp: , Rfl:    atenolol (TENORMIN) 50 MG tablet, Take 50 mg by mouth daily., Disp: , Rfl:    cholecalciferol (VITAMIN D) 1000 units tablet, Take 1,000 Units by  mouth daily., Disp: , Rfl:    doxycycline (VIBRAMYCIN) 100 MG capsule, Take 1 capsule (100 mg total) by mouth 2 (two) times daily., Disp: 14 capsule, Rfl: 0   folic acid (FOLVITE) 1 MG tablet, Take 1 mg by mouth daily., Disp: , Rfl:    hydrochlorothiazide (HYDRODIURIL) 25 MG tablet, Take 25 mg by mouth daily., Disp: , Rfl:    medroxyPROGESTERone (DEPO-PROVERA) 150 MG/ML injection, Inject 1 mL (150 mg total) into the muscle every 3 (three) months., Disp: 1 mL, Rfl: 3   metroNIDAZOLE (FLAGYL) 500 MG tablet, Take 1 tablet (500 mg total) by mouth 2 (two) times daily., Disp: 14 tablet, Rfl: 0   mometasone (NASONEX) 50 MCG/ACT nasal spray, Place into the nose., Disp: , Rfl:    norethindrone-ethinyl estradiol 1/35 (ORTHO-NOVUM 1/35, 28,) tablet, Take 1 active tab po every 8 hours with 1st pack, then 1 tab po daily.  Dispense 2 packs with 1st prescription pick-up., Disp: 28 tablet, Rfl: 11   oxyCODONE-acetaminophen (PERCOCET) 10-325 MG tablet, Take 1 tablet by mouth 3 (three) times daily., Disp: , Rfl:    phentermine (ADIPEX-P) 37.5 MG tablet, Take 37.5 mg by mouth every morning., Disp: , Rfl: 0   traMADol (ULTRAM) 50 MG tablet, Take by mouth every 6 (six) hours as needed.,  Disp: , Rfl:   Current Facility-Administered Medications:    medroxyPROGESTERone (DEPO-PROVERA) injection 150 mg, 150 mg, Intramuscular, Q90 days, Hermina Staggers, MD, 150 mg at 11/04/21 1644   Objective:   Vitals:   10/19/22 1050  BP: 130/80    Physical Exam Vitals and nursing note reviewed.  Constitutional:      Appearance: Normal appearance.  HENT:     Head: Normocephalic and atraumatic.  Cardiovascular:     Rate and Rhythm: Normal rate.     Pulses: Normal pulses.  Pulmonary:     Effort: Pulmonary effort is normal.  Abdominal:     General: There is no distension.     Palpations: Abdomen is soft. There is no mass.     Tenderness: There is no abdominal tenderness.     Hernia: No hernia is present.  Musculoskeletal:         General: No swelling or tenderness.  Skin:    General: Skin is warm.     Capillary Refill: Capillary refill takes less than 2 seconds.     Coloration: Skin is not jaundiced.     Findings: No bruising.  Neurological:     Mental Status: She is alert and oriented to person, place, and time.  Psychiatric:        Mood and Affect: Mood normal.        Behavior: Behavior normal.        Thought Content: Thought content normal.        Judgment: Judgment normal.     Assessment & Plan:  Panniculitis  I recommend the patient get closer to her ideal weight prior to having a panniculectomy.  Patient is in agreement.  She will plan to continue her work and see [place] back in approximately 6 months.  Pictures were obtained of the patient and placed in the chart with the patient's or guardian's permission.   Alena Bills Rosselyn Martha, DO

## 2022-10-21 ENCOUNTER — Emergency Department (HOSPITAL_COMMUNITY)
Admission: EM | Admit: 2022-10-21 | Discharge: 2022-10-21 | Disposition: A | Discharge status: Home / Self Care | Payer: Medicaid Other | Attending: Emergency Medicine | Admitting: Emergency Medicine

## 2022-10-21 ENCOUNTER — Other Ambulatory Visit: Payer: Self-pay

## 2022-10-21 ENCOUNTER — Emergency Department (HOSPITAL_COMMUNITY): Payer: Medicaid Other

## 2022-10-21 ENCOUNTER — Encounter (HOSPITAL_COMMUNITY): Payer: Self-pay | Admitting: *Deleted

## 2022-10-21 DIAGNOSIS — R1011 Right upper quadrant pain: Secondary | ICD-10-CM | POA: Insufficient documentation

## 2022-10-21 DIAGNOSIS — D72819 Decreased white blood cell count, unspecified: Secondary | ICD-10-CM | POA: Insufficient documentation

## 2022-10-21 DIAGNOSIS — R109 Unspecified abdominal pain: Secondary | ICD-10-CM | POA: Diagnosis present

## 2022-10-21 DIAGNOSIS — R1013 Epigastric pain: Secondary | ICD-10-CM | POA: Diagnosis not present

## 2022-10-21 DIAGNOSIS — N939 Abnormal uterine and vaginal bleeding, unspecified: Secondary | ICD-10-CM | POA: Diagnosis not present

## 2022-10-21 DIAGNOSIS — Z91040 Latex allergy status: Secondary | ICD-10-CM | POA: Diagnosis not present

## 2022-10-21 LAB — COMPREHENSIVE METABOLIC PANEL
ALT: 9 U/L (ref 0–44)
AST: 20 U/L (ref 15–41)
Albumin: 3.6 g/dL (ref 3.5–5.0)
Alkaline Phosphatase: 97 U/L (ref 38–126)
Anion gap: 8 (ref 5–15)
BUN: 7 mg/dL (ref 6–20)
CO2: 25 mmol/L (ref 22–32)
Calcium: 8.6 mg/dL — ABNORMAL LOW (ref 8.9–10.3)
Chloride: 105 mmol/L (ref 98–111)
Creatinine, Ser: 0.76 mg/dL (ref 0.44–1.00)
GFR, Estimated: >60 mL/min (ref >60)
Glucose, Bld: 96 mg/dL (ref 70–99)
Potassium: 4.2 mmol/L (ref 3.5–5.1)
Sodium: 138 mmol/L (ref 135–145)
Total Bilirubin: 0.9 mg/dL (ref 0.3–1.2)
Total Protein: 6.9 g/dL (ref 6.5–8.1)

## 2022-10-21 LAB — URINALYSIS, ROUTINE W REFLEX MICROSCOPIC
Bacteria, UA: NONE SEEN
Bilirubin Urine: NEGATIVE
Glucose, UA: NEGATIVE mg/dL
Ketones, ur: NEGATIVE mg/dL
Leukocytes,Ua: NEGATIVE
Nitrite: NEGATIVE
Protein, ur: NEGATIVE mg/dL
Specific Gravity, Urine: 1.014 (ref 1.005–1.030)
pH: 5 (ref 5.0–8.0)

## 2022-10-21 LAB — LIPASE, BLOOD: Lipase: 24 U/L (ref 11–51)

## 2022-10-21 LAB — CBC WITH DIFFERENTIAL/PLATELET
Abs Immature Granulocytes: 0 10*3/uL (ref 0.00–0.07)
Basophils Absolute: 0 10*3/uL (ref 0.0–0.1)
Basophils Relative: 1 %
Eosinophils Absolute: 0.2 10*3/uL (ref 0.0–0.5)
Eosinophils Relative: 4 %
HCT: 34.6 % — ABNORMAL LOW (ref 36.0–46.0)
Hemoglobin: 10.5 g/dL — ABNORMAL LOW (ref 12.0–15.0)
Immature Granulocytes: 0 %
Lymphocytes Relative: 27 %
Lymphs Abs: 1 10*3/uL (ref 0.7–4.0)
MCH: 22.2 pg — ABNORMAL LOW (ref 26.0–34.0)
MCHC: 30.3 g/dL (ref 30.0–36.0)
MCV: 73.3 fL — ABNORMAL LOW (ref 80.0–100.0)
Monocytes Absolute: 0.4 10*3/uL (ref 0.1–1.0)
Monocytes Relative: 11 %
Neutro Abs: 2.2 10*3/uL (ref 1.7–7.7)
Neutrophils Relative %: 57 %
Platelets: 258 10*3/uL (ref 150–400)
RBC: 4.72 MIL/uL (ref 3.87–5.11)
RDW: 17.8 % — ABNORMAL HIGH (ref 11.5–15.5)
WBC: 3.8 10*3/uL — ABNORMAL LOW (ref 4.0–10.5)
nRBC: 0 % (ref 0.0–0.2)

## 2022-10-21 LAB — PREGNANCY, URINE: Preg Test, Ur: NEGATIVE

## 2022-10-21 LAB — HIV ANTIBODY (ROUTINE TESTING W REFLEX): HIV Screen 4th Generation wRfx: NONREACTIVE

## 2022-10-21 LAB — RPR: RPR Ser Ql: NONREACTIVE

## 2022-10-21 MED ORDER — SODIUM CHLORIDE 0.9 % IV BOLUS
1000.0000 mL | Freq: Once | INTRAVENOUS | Status: AC
  Administered 2022-10-21: 1000 mL via INTRAVENOUS

## 2022-10-21 MED ORDER — IOHEXOL 300 MG/ML  SOLN
100.0000 mL | Freq: Once | INTRAMUSCULAR | Status: AC | PRN
  Administered 2022-10-21: 100 mL via INTRAVENOUS

## 2022-10-21 MED ORDER — HYDROMORPHONE HCL 1 MG/ML IJ SOLN
1.0000 mg | Freq: Once | INTRAMUSCULAR | Status: AC
  Administered 2022-10-21: 1 mg via INTRAVENOUS
  Filled 2022-10-21: qty 1

## 2022-10-21 MED ORDER — SODIUM CHLORIDE (PF) 0.9 % IJ SOLN
INTRAMUSCULAR | Status: AC
  Filled 2022-10-21: qty 50

## 2022-10-21 NOTE — ED Provider Notes (Signed)
[facility] AT [facility] Provider Note   CSN: [id number] Arrival date & time: 10/21/22  7829     History  Chief Complaint  Patient presents with   Flank Pain    CHELBY SALATA is a 37 y.o. female.  HPI 37 year old female presents with right-sided flank pain.  This has been ongoing on and off for about 4 days.  Certain movements will make it worse and sometimes driving will make it worse.  She has chronic hip and knee pain on that side but states this feels different.  Over the last 24 hours has also noticed epigastric pain.  No vomiting, diarrhea, or urinary symptoms.  No fevers.  This does feel similar to when she was seen in the ER several months ago for similar pain.  She thinks she might of had a kidney stone then.  Right now the pain is about an 8 out of 10.  Patient has noticed some vaginal spotting, thinks is the beginning of her menstrual cycle.  She also states she is sexually active and would like to be checked for STI though has no symptoms.  Home Medications Prior to Admission medications   Medication Sig Start Date End Date Taking? Authorizing Provider  albuterol (PROVENTIL HFA;VENTOLIN HFA) 108 (90 BASE) MCG/ACT inhaler Inhale 2 puffs into the lungs every 6 (six) hours as needed for wheezing or shortness of breath.    Yes [provider]  atenolol (TENORMIN) 50 MG tablet Take 50 mg by mouth daily.   Yes [provider]  cholecalciferol (VITAMIN D) 1000 units tablet Take 1,000 Units by mouth daily.   Yes [provider]  hydrochlorothiazide (HYDRODIURIL) 25 MG tablet Take 25 mg by mouth daily.   Yes [provider]  mometasone (NASONEX) 50 MCG/ACT nasal spray Place 2 sprays into the nose daily.   Yes [provider]  oxyCODONE-acetaminophen (PERCOCET) 10-325 MG tablet Take 1 tablet by mouth 3 (three) times daily.   Yes [provider]  phentermine (ADIPEX-P) 37.5 MG tablet Take 37.5  mg by mouth every morning. 12/14/16  Yes [provider]  traMADol (ULTRAM) 50 MG tablet Take 50 mg by mouth every 6 (six) hours as needed for moderate pain.   Yes [provider]      Allergies    Adhesive [tape], Clindamycin/lincomycin, and Latex    Review of Systems   Review of Systems  Constitutional:  Negative for fever.  Gastrointestinal:  Positive for abdominal pain. Negative for vomiting.  Genitourinary:  Positive for flank pain and vaginal bleeding (spotting). Negative for vaginal discharge.    Physical Exam Updated Vital Signs BP 132/84   Pulse 71   Temp 98.4 F (36.9 C)   Resp 16   Ht 5\' 6"  (1.676 m)   Wt (!) 140.6 kg   SpO2 100%   BMI 50.04 kg/m  Physical Exam Vitals and nursing note reviewed.  Constitutional:      General: She is not in acute distress.    Appearance: She is well-developed. She is obese. She is not ill-appearing or diaphoretic.  HENT:     Head: Normocephalic and atraumatic.  Cardiovascular:     Rate and Rhythm: Normal rate and regular rhythm.     Pulses:          Posterior tibial pulses are 2+ on the right side.     Heart sounds: Normal heart sounds.  Pulmonary:     Effort: Pulmonary effort is normal.  Breath sounds: Normal breath sounds.  Abdominal:     Palpations: Abdomen is soft.     Tenderness: There is abdominal tenderness in the right upper quadrant and epigastric area. There is no right CVA tenderness.  Musculoskeletal:     Right hip: No bony tenderness. Normal range of motion.  Skin:    General: Skin is warm and dry.  Neurological:     Mental Status: She is alert.     ED Results / Procedures / Treatments   Labs (all labs ordered are listed, but only abnormal results are displayed) Labs Reviewed  URINALYSIS, ROUTINE W REFLEX MICROSCOPIC - Abnormal; Notable for the following components:      Result Value   Hgb urine dipstick LARGE (*)    All other components within normal limits  COMPREHENSIVE  METABOLIC PANEL - Abnormal; Notable for the following components:   Calcium 8.6 (*)    All other components within normal limits  CBC WITH DIFFERENTIAL/PLATELET - Abnormal; Notable for the following components:   WBC 3.8 (*)    Hemoglobin 10.5 (*)    HCT 34.6 (*)    MCV 73.3 (*)    MCH 22.2 (*)    RDW 17.8 (*)    All other components within normal limits  PREGNANCY, URINE  LIPASE, BLOOD  RPR  HIV ANTIBODY (ROUTINE TESTING W REFLEX)  GC/CHLAMYDIA PROBE AMP ([place]) NOT AT [facility]    EKG None  Radiology CT ABDOMEN PELVIS W CONTRAST  Result Date: 10/21/2022 CLINICAL DATA:  Right flank pain. EXAM: CT ABDOMEN AND PELVIS WITH CONTRAST TECHNIQUE: Multidetector CT imaging of the abdomen and pelvis was performed using the standard protocol following bolus administration of intravenous contrast. RADIATION DOSE REDUCTION: This exam was performed according to the departmental dose-optimization program which includes automated exposure control, adjustment of the mA and/or kV according to patient size and/or use of iterative reconstruction technique. CONTRAST:  OMNIPAQUE IOHEXOL 300 MG/ML  SOLN COMPARISON:  CT abdomen/pelvis dated April 10, 2022. FINDINGS: Lower chest: No acute abnormality. Hepatobiliary: No focal liver abnormality is seen. No gallstones, gallbladder wall thickening, or biliary dilatation. Pancreas: Unremarkable. No pancreatic ductal dilatation or surrounding inflammatory changes. Spleen: Normal in size without focal abnormality. Adrenals/Urinary Tract: Adrenal glands are unremarkable. Kidneys are normal, without renal calculi, suspicious focal lesion, or hydronephrosis. Bladder is unremarkable. Stomach/Bowel: Prior gastric bypass. No evidence of obstruction. Normal appendix. Vascular/Lymphatic: Normal caliber aorta.Prominent mesenteric nodes are again noted. No enlarged abdominal or pelvic lymph nodes by size criteria. Reproductive: Uterus and bilateral adnexa are unremarkable.  Other: No abdominal wall hernia or abnormality. No abdominopelvic ascites. Musculoskeletal: Lower lumbar degenerative disc changes. No acute osseous abnormality. IMPRESSION: 1. No acute localizing findings in the abdomen or pelvis. 2. Prominent mesenteric lymph nodes, similar to the prior exam, are nonspecific. 3. Additional unchanged ancillary findings as described above. Electronically Signed   By: Hart Robinsons M.D.   On: 10/21/2022 11:40    Procedures Procedures    Medications Ordered in ED Medications  sodium chloride 0.9 % bolus 1,000 mL (0 mLs Intravenous Stopped 10/21/22 1248)  HYDROmorphone (DILAUDID) injection 1 mg (1 mg Intravenous Given 10/21/22 0937)  iohexol (OMNIPAQUE) 300 MG/ML solution 100 mL (100 mLs Intravenous Contrast Given 10/21/22 1027)    ED Course/ Medical Decision Making/ A&P                                 Medical Decision  Making Amount and/or Complexity of Data Reviewed External Data Reviewed: notes. Labs: ordered.    Details: Mild leukopenia, blood in urine Radiology: ordered and independent interpretation performed.    Details: No bowel obstruction  Risk Prescription drug management.   Patient presents with right flank/abdominal pain.  Similar to when she was here back in March.  At that time there was no clear cause.  Followed up with her surgeon as well.  At this point, she was given a dose of pain medicine and labs were evaluated as well as a CT.  She has right upper quadrant pain but also epigastric pain.  No clear findings.  No obvious cholelithiasis or ureterolithiasis.  Given no gallstones with benign LFTs and lipase and a similar presentation without clear cause, I do not think an ultrasound is warranted.  This could be muscular as it was presumed last time.  She still has some of her leftover muscle relaxer as well as pain medicine.  No signs of a UTI.  Will discharge home with return precautions and follow-up with her surgeon and PCP.  Of note,  she is asymptomatic from an STI standpoint but was asking for evaluation and this has been sent via blood and urine testing.  However with no symptoms I do not think treatment is warranted at this time.        Final Clinical Impression(s) / ED Diagnoses Final diagnoses:  Right sided abdominal pain    Rx / DC Orders ED Discharge Orders     None         Pricilla Loveless, MD 10/21/22 1310

## 2022-10-21 NOTE — Discharge Instructions (Addendum)
If you develop worsening, continued, or recurrent abdominal pain, uncontrolled vomiting, fever, chest or back pain, or any other new/concerning symptoms then return to the ER for evaluation.  

## 2022-10-21 NOTE — ED Triage Notes (Addendum)
R Flank pain x 4 days, no difficulty urinating, reposrts epigastric pain started this morning

## 2022-10-25 LAB — GC/CHLAMYDIA PROBE AMP (~~LOC~~) NOT AT ARMC
Chlamydia: NEGATIVE
Comment: NEGATIVE
Comment: NORMAL
Neisseria Gonorrhea: NEGATIVE

## 2023-04-19 ENCOUNTER — Ambulatory Visit: Payer: Medicaid Other | Admitting: Plastic Surgery

## 2023-08-15 ENCOUNTER — Ambulatory Visit (INDEPENDENT_AMBULATORY_CARE_PROVIDER_SITE_OTHER): Admitting: Plastic Surgery

## 2023-08-15 ENCOUNTER — Encounter: Payer: Self-pay | Admitting: Plastic Surgery

## 2023-08-15 VITALS — BP 116/80 | HR 97 | Ht 66.0 in | Wt 296.8 lb

## 2023-08-15 DIAGNOSIS — M546 Pain in thoracic spine: Secondary | ICD-10-CM | POA: Diagnosis not present

## 2023-08-15 DIAGNOSIS — Z6841 Body Mass Index (BMI) 40.0 and over, adult: Secondary | ICD-10-CM | POA: Diagnosis not present

## 2023-08-15 DIAGNOSIS — M793 Panniculitis, unspecified: Secondary | ICD-10-CM

## 2023-08-15 DIAGNOSIS — M549 Dorsalgia, unspecified: Secondary | ICD-10-CM | POA: Insufficient documentation

## 2023-08-15 DIAGNOSIS — G8929 Other chronic pain: Secondary | ICD-10-CM

## 2023-08-15 NOTE — Progress Notes (Signed)
   Subjective:    Patient ID: Shannon Huffman, female    DOB: 01/21/1985, 38 y.o.   MRN: [id number]  The patient is a 38 year old female here for follow-up on her abdomen.  She is 5 feet 6 inches tall and weighs 296 pounds.  She has lost weight since her last visit.  She has been diligent about her weight loss.  Her complaints have been hanging pannus with back pain.  She also has skin breakdown in her folds that is not helped with conservative treatment.  No sign of hernia.      Review of Systems  Constitutional: Negative.   HENT: Negative.    Eyes: Negative.   Respiratory: Negative.    Cardiovascular: Negative.   Gastrointestinal: Negative.   Endocrine: Negative.   Genitourinary: Negative.   Musculoskeletal:  Positive for back pain and neck pain.  Skin:  Positive for rash.       Objective:   Physical Exam Vitals reviewed.  Constitutional:      Appearance: Normal appearance.  Cardiovascular:     Rate and Rhythm: Normal rate.     Pulses: Normal pulses.  Pulmonary:     Effort: Pulmonary effort is normal.  Musculoskeletal:        General: No swelling or deformity.  Skin:    General: Skin is warm.     Capillary Refill: Capillary refill takes less than 2 seconds.  Neurological:     Mental Status: She is alert and oriented to person, place, and time.  Psychiatric:        Mood and Affect: Mood normal.        Behavior: Behavior normal.        Thought Content: Thought content normal.         Assessment & Plan:     ICD-10-CM   1. Panniculitis  M79.3     2. Morbid obesity with BMI of 50.0-59.9, adult (HCC)  E66.01    Z68.43     3. Chronic bilateral thoracic back pain  M54.6    G89.29        Plan for panniculectomy with possible add-on upper abdomen.  With liposuction.  Pictures were obtained of the patient and placed in the chart with the patient's or guardian's permission.

## 2023-08-19 ENCOUNTER — Ambulatory Visit: Admitting: Plastic Surgery

## 2023-08-31 ENCOUNTER — Telehealth: Payer: Self-pay

## 2023-08-31 ENCOUNTER — Other Ambulatory Visit: Payer: Self-pay

## 2023-08-31 ENCOUNTER — Encounter (HOSPITAL_BASED_OUTPATIENT_CLINIC_OR_DEPARTMENT_OTHER): Payer: Self-pay | Admitting: Plastic Surgery

## 2023-08-31 ENCOUNTER — Encounter: Payer: Self-pay | Admitting: Surgical

## 2023-08-31 ENCOUNTER — Ambulatory Visit: Admitting: Surgical

## 2023-08-31 VITALS — BP 132/85 | Ht 66.0 in | Wt 290.2 lb

## 2023-08-31 DIAGNOSIS — M793 Panniculitis, unspecified: Secondary | ICD-10-CM

## 2023-08-31 DIAGNOSIS — Z6841 Body Mass Index (BMI) 40.0 and over, adult: Secondary | ICD-10-CM

## 2023-08-31 DIAGNOSIS — G8929 Other chronic pain: Secondary | ICD-10-CM

## 2023-08-31 DIAGNOSIS — M546 Pain in thoracic spine: Secondary | ICD-10-CM

## 2023-08-31 MED ORDER — ONDANSETRON HCL 4 MG PO TABS: 4.0000 mg | ORAL_TABLET | Freq: Three times a day (TID) | ORAL | 0 refills | Status: AC | PRN

## 2023-08-31 MED ORDER — CEPHALEXIN 500 MG PO CAPS: 500.0000 mg | ORAL_CAPSULE | Freq: Four times a day (QID) | ORAL | 0 refills | Status: AC

## 2023-08-31 NOTE — H&P (View-Only) (Signed)
 Patient ID: Shannon Huffman, female    DOB: [birth year], 38 y.o.   MRN: [id number]  Chief Complaint  Patient presents with   Pre-op Exam      ICD-10-CM   1. Panniculitis  M79.3     2. Morbid obesity with BMI of 50.0-59.9, adult (HCC)  E66.01    Z68.43     3. Chronic bilateral thoracic back pain  M54.6    G89.29        History of Present Illness: Shannon Huffman is a 38 y.o.  female  with a history of bariatric surgery and significant weight loss.  She presents for preoperative evaluation for upcoming procedure, panniculectomy, scheduled for 09/07/2023 with Dr. Lowery.  The patient has not had problems with anesthesia. No history of DVT/PE.  No family history of DVT/PE.  No family or personal history of bleeding or clotting disorders.  Patient is not currently taking any blood thinners.  No history of CVA/MI.   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 She went from 467 pounds to 315 pounds. Since January she lost 70 additional pounds. She is 5 feet 6 inches tall. She complains of neck and back pain due to her pannus. She has rashes and skin breakdown. She has tried creams to try and help alleviate the skin breakdown and the odor. With increased weight loss it seems to get worse over time. No sign of a hernia. She has had her tonsils and wisdom teeth removed in the past. No other surgery. He does have a seizure disorder, asthma and hypertension.   Patient is not requesting FMLA forms  PMH Significant for: Seizure disorder/epilepsy, asthma, sleep apnea (resolved with weight loss), on Wegovy, on Percocet 10 for chronic pain  Patient reports she doses her Georjean is on Monday, she dosed this Monday but is aware to not dose upcoming Monday prior to surgery.  She reports her sleep apnea has resolved with weight loss.  She reports she has not had a seizure in greater than 5 years.  She reports that she does have asthma and uses her  albuterol inhaler as needed, reports last use of albuterol was last week.  She reports that she did have a cold last week but seems to be improving and does not have any cold symptoms with the exception of a mild cough at this time which is improving.  She denies any shortness of breath, chest pain, nausea, vomiting, dizziness, weakness.  She denies any history of Crohn's or ulcerative colitis.  She is not on any hormone medication at this time.  She does not have any history of varicose veins or lower extremity swelling.  She does not have any changes in her health in the last month.  She does not have any history of miscarriages.  No history of cancer.  Past Medical History: Allergies: Allergies  Allergen Reactions   Adhesive [Tape] Rash   Clindamycin/Lincomycin Rash   Latex     Current Medications:  Current Outpatient Medications:    albuterol (PROVENTIL HFA;VENTOLIN HFA) 108 (90 BASE) MCG/ACT inhaler, Inhale 2 puffs into the lungs every 6 (six) hours as needed for wheezing or shortness of breath. , Disp: , Rfl:    atenolol (TENORMIN) 50 MG tablet, Take 50 mg by mouth daily., Disp: , Rfl:    cephALEXin  (KEFLEX ) 500 MG capsule, Take 1 capsule (500 mg total) by mouth 4 (four) times daily for 3 days.,  Disp: 12 capsule, Rfl: 0   cholecalciferol (VITAMIN D) 1000 units tablet, Take 1,000 Units by mouth daily., Disp: , Rfl:    hydrochlorothiazide (HYDRODIURIL) 25 MG tablet, Take 25 mg by mouth daily., Disp: , Rfl:    mometasone (NASONEX) 50 MCG/ACT nasal spray, Place 2 sprays into the nose daily., Disp: , Rfl:    ondansetron  (ZOFRAN ) 4 MG tablet, Take 1 tablet (4 mg total) by mouth every 8 (eight) hours as needed for nausea or vomiting., Disp: 20 tablet, Rfl: 0   oxyCODONE -acetaminophen  (PERCOCET) 10-325 MG tablet, Take 1 tablet by mouth 3 (three) times daily., Disp: , Rfl:    phentermine (ADIPEX-P) 37.5 MG tablet, Take 37.5 mg by mouth every morning., Disp: , Rfl: 0   traMADol  (ULTRAM ) 50 MG  tablet, Take 50 mg by mouth every 6 (six) hours as needed for moderate pain., Disp: , Rfl:    WEGOVY 1.7 MG/0.75ML SOAJ, Inject 1.7 mg into the skin once a week., Disp: , Rfl:   Current Facility-Administered Medications:    medroxyPROGESTERone  (DEPO-PROVERA ) injection 150 mg, 150 mg, Intramuscular, Q90 days, Ervin, Michael L, MD, 150 mg at 11/04/21 1644  Past Medical Problems: Past Medical History:  Diagnosis Date   Asthma    Epilepsy (HCC)    Hypertension 06/2016   Morbid obesity (HCC)    Seizures (HCC)    Sleep apnea     Past Surgical History: Past Surgical History:  Procedure Laterality Date   LAPAROSCOPIC GASTRIC SLEEVE RESECTION  10/08/2019   right knee surgery Right 01/19/2012   TONSILLECTOMY AND ADENOIDECTOMY  childhood    Social History: Social History   Socioeconomic History   Marital status: Single    Spouse name: Not on file   Number of children: Not on file   Years of education: Not on file   Highest education level: Not on file  Occupational History   Not on file  Tobacco Use   Smoking status: Never   Smokeless tobacco: Never  Vaping Use   Vaping status: Never Used  Substance and Sexual Activity   Alcohol use: No    Comment: social   Drug use: No   Sexual activity: Not Currently    Birth control/protection: None  Other Topics Concern   Not on file  Social History Narrative   Not on file   Social Drivers of Health   Financial Resource Strain: Patient Declined (07/21/2023)   Received from [facility]   Overall Financial Resource Strain (CARDIA)    Difficulty of Paying Living Expenses: Patient declined  Food Insecurity: Patient Declined (07/21/2023)   Received from [facility]   Hunger Vital Sign    Within the past 12 months, you worried that your food would run out before you got the money to buy more.: Patient declined    Within the past 12 months, the food you bought just didn't last and you didn't have money to get more.: Patient declined   Transportation Needs: Patient Declined (07/21/2023)   Received from [facility] - Transportation    Lack of Transportation (Medical): Patient declined    Lack of Transportation (Non-Medical): Patient declined  Physical Activity: Unknown (07/21/2023)   Received from [facility]   Exercise Vital Sign    On average, how many days per week do you engage in moderate to strenuous exercise (like a brisk walk)?: Patient declined    Minutes of Exercise per Session: Not on file  Stress: Patient Declined (07/21/2023)   Received  from [facility] - Occupational Stress Questionnaire    Feeling of Stress : Patient declined  Social Connections: Patient Declined (07/21/2023)   Received from [facility]   Social Network    How would you rate your social network (family, work, friends)?: Patient declined  Intimate Partner Violence: Not At Risk (04/12/2022)   Received from [facility]   HITS    Over the last 12 months how often did your partner physically hurt you?: Never    Over the last 12 months how often did your partner insult you or talk down to you?: Never    Over the last 12 months how often did your partner threaten you with physical harm?: Never    Over the last 12 months how often did your partner scream or curse at you?: Never    Family History: Family History  Problem Relation Age of Onset   Diabetes Father    Hypertension Father    Hypertension Mother    Hypertension Maternal Grandmother    Diabetes Maternal Grandmother    Hypertension Maternal Grandfather    Diabetes Maternal Grandfather    Hypertension Paternal Grandmother    Diabetes Paternal Grandmother    Hypertension Paternal Grandfather    Diabetes Paternal Grandfather     Review of Systems: ROS  Physical Exam: Vital Signs BP 132/85 (BP Location: Left Arm, Patient Position: Sitting, Cuff Size: Large)   Ht 5' 6 (1.676 m)   Wt 290 lb 3.2 oz (131.6 kg)   SpO2  92%   BMI 46.84 kg/m   Physical Exam Constitutional:      General: Not in acute distress.    Appearance: Normal appearance. Not ill-appearing.  HENT:     Head: Normocephalic and atraumatic.  Eyes:     Pupils: Pupils are equal, round Neck:     Musculoskeletal: Normal range of motion.  Cardiovascular:     Rate and Rhythm: Normal rate    Pulses: Normal pulses.  Pulmonary:     Effort: Pulmonary effort is normal. No respiratory distress.  Abdominal:     General: Abdomen is flat. There is no distension.  Musculoskeletal: Normal range of motion.  Skin:    General: Skin is warm and dry.     Findings: No erythema or rash.  Neurological:     General: No focal deficit present.     Mental Status: Alert and oriented to person, place, and time. Mental status is at baseline.     Motor: No weakness.  Psychiatric:        Mood and Affect: Mood normal.        Behavior: Behavior normal.    Assessment/Plan: The patient is scheduled for panniculectomy with Dr. Lowery.  Risks, benefits, and alternatives of procedure discussed, questions answered and consent obtained.    Smoking Status: Non-smoker Counseling Given?  N/A  Caprini Score: 4; Risk Factors include: BMI greater than 40, and length of planned surgery. Recommendation for mechanical prophylaxis. Encourage early ambulation.   Pictures obtained: @consult   Post-op Rx sent to pharmacy: Oxycodone , Zofran   Patient was provided with the General Surgical Risk consent document and Pain Medication Agreement prior to their appointment.  They had adequate time to read through the risk consent documents and Pain Medication Agreement. We also discussed them in person together during this preop appointment. All of their questions were answered to their satisfaction.  Recommended calling if they have any further questions.  Risk consent form and  Pain Medication Agreement to be scanned into patient's chart.  The risk that can be encountered for  this procedure were discussed and include the following but not limited to these: asymmetry, fluid accumulation, firmness of the tissue, skin loss, decrease or no sensation, fat necrosis, bleeding, infection, healing delay.  Deep vein thrombosis, cardiac and pulmonary complications are risks to any procedure.  There are risks of anesthesia, changes to skin sensation and injury to nerves or blood vessels.  The muscle can be temporarily or permanently injured.  You may have an allergic reaction to tape, suture, glue, blood products which can result in skin discoloration, swelling, pain, skin lesions, poor healing.  Any of these can lead to the need for revisonal surgery or stage procedures.  Weight gain and weigh loss can also effect the long term appearance. The results are not guaranteed to last a lifetime.  Future surgery may be required.    Surgical clearance sent today, discussed with patient that if not received prior to surgery, surgery may need to be rescheduled.  Patient is also on a pain contract with PCP for Percocet 10, discussed with patient that no narcotics will be prescribed unless clearance is provided by PCP/pain management provider.  Patient is aware that if she continues to notice a worsening cough or any cold symptoms develop that surgery will need to be rescheduled and please notify our office.  Electronically signed by: Donnice PARAS Delan Ksiazek, PA-C 08/31/2023 9:27 AM

## 2023-08-31 NOTE — Progress Notes (Signed)
 Patient ID: Shannon Huffman, female    DOB: 1985/10/24, 38 y.o.   MRN: [id number]  Chief Complaint  Patient presents with   Pre-op Exam      ICD-10-CM   1. Panniculitis  M79.3     2. Morbid obesity with BMI of 50.0-59.9, adult (HCC)  E66.01    Z68.43     3. Chronic bilateral thoracic back pain  M54.6    G89.29        History of Present Illness: Shannon Huffman is a 38 y.o.  female  with a history of bariatric surgery and significant weight loss.  She presents for preoperative evaluation for upcoming procedure, panniculectomy, scheduled for 09/07/2023 with Dr. Lowery.  The patient has not had problems with anesthesia. No history of DVT/PE.  No family history of DVT/PE.  No family or personal history of bleeding or clotting disorders.  Patient is not currently taking any blood thinners.  No history of CVA/MI.   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 She went from 467 pounds to 315 pounds. Since January she lost 70 additional pounds. She is 5 feet 6 inches tall. She complains of neck and back pain due to her pannus. She has rashes and skin breakdown. She has tried creams to try and help alleviate the skin breakdown and the odor. With increased weight loss it seems to get worse over time. No sign of a hernia. She has had her tonsils and wisdom teeth removed in the past. No other surgery. He does have a seizure disorder, asthma and hypertension.   Patient is not requesting FMLA forms  PMH Significant for: Seizure disorder/epilepsy, asthma, sleep apnea (resolved with weight loss), on Wegovy, on Percocet 10 for chronic pain  Patient reports she doses her Georjean is on Monday, she dosed this Monday but is aware to not dose upcoming Monday prior to surgery.  She reports her sleep apnea has resolved with weight loss.  She reports she has not had a seizure in greater than 5 years.  She reports that she does have asthma and uses her  albuterol inhaler as needed, reports last use of albuterol was last week.  She reports that she did have a cold last week but seems to be improving and does not have any cold symptoms with the exception of a mild cough at this time which is improving.  She denies any shortness of breath, chest pain, nausea, vomiting, dizziness, weakness.  She denies any history of Crohn's or ulcerative colitis.  She is not on any hormone medication at this time.  She does not have any history of varicose veins or lower extremity swelling.  She does not have any changes in her health in the last month.  She does not have any history of miscarriages.  No history of cancer.  Past Medical History: Allergies: Allergies  Allergen Reactions   Adhesive [Tape] Rash   Clindamycin/Lincomycin Rash   Latex     Current Medications:  Current Outpatient Medications:    albuterol (PROVENTIL HFA;VENTOLIN HFA) 108 (90 BASE) MCG/ACT inhaler, Inhale 2 puffs into the lungs every 6 (six) hours as needed for wheezing or shortness of breath. , Disp: , Rfl:    atenolol (TENORMIN) 50 MG tablet, Take 50 mg by mouth daily., Disp: , Rfl:    cephALEXin  (KEFLEX ) 500 MG capsule, Take 1 capsule (500 mg total) by mouth 4 (four) times daily for 3 days.,  Disp: 12 capsule, Rfl: 0   cholecalciferol (VITAMIN D) 1000 units tablet, Take 1,000 Units by mouth daily., Disp: , Rfl:    hydrochlorothiazide (HYDRODIURIL) 25 MG tablet, Take 25 mg by mouth daily., Disp: , Rfl:    mometasone (NASONEX) 50 MCG/ACT nasal spray, Place 2 sprays into the nose daily., Disp: , Rfl:    ondansetron  (ZOFRAN ) 4 MG tablet, Take 1 tablet (4 mg total) by mouth every 8 (eight) hours as needed for nausea or vomiting., Disp: 20 tablet, Rfl: 0   oxyCODONE -acetaminophen  (PERCOCET) 10-325 MG tablet, Take 1 tablet by mouth 3 (three) times daily., Disp: , Rfl:    phentermine (ADIPEX-P) 37.5 MG tablet, Take 37.5 mg by mouth every morning., Disp: , Rfl: 0   traMADol  (ULTRAM ) 50 MG  tablet, Take 50 mg by mouth every 6 (six) hours as needed for moderate pain., Disp: , Rfl:    WEGOVY 1.7 MG/0.75ML SOAJ, Inject 1.7 mg into the skin once a week., Disp: , Rfl:   Current Facility-Administered Medications:    medroxyPROGESTERone  (DEPO-PROVERA ) injection 150 mg, 150 mg, Intramuscular, Q90 days, Ervin, Michael L, MD, 150 mg at 11/04/21 1644  Past Medical Problems: Past Medical History:  Diagnosis Date   Asthma    Epilepsy (HCC)    Hypertension 06/2016   Morbid obesity (HCC)    Seizures (HCC)    Sleep apnea     Past Surgical History: Past Surgical History:  Procedure Laterality Date   LAPAROSCOPIC GASTRIC SLEEVE RESECTION  10/08/2019   right knee surgery Right 01/19/2012   TONSILLECTOMY AND ADENOIDECTOMY  childhood    Social History: Social History   Socioeconomic History   Marital status: Single    Spouse name: Not on file   Number of children: Not on file   Years of education: Not on file   Highest education level: Not on file  Occupational History   Not on file  Tobacco Use   Smoking status: Never   Smokeless tobacco: Never  Vaping Use   Vaping status: Never Used  Substance and Sexual Activity   Alcohol use: No    Comment: social   Drug use: No   Sexual activity: Not Currently    Birth control/protection: None  Other Topics Concern   Not on file  Social History Narrative   Not on file   Social Drivers of Health   Financial Resource Strain: Patient Declined (07/21/2023)   Received from [facility]   Overall Financial Resource Strain (CARDIA)    Difficulty of Paying Living Expenses: Patient declined  Food Insecurity: Patient Declined (07/21/2023)   Received from [facility]   Hunger Vital Sign    Within the past 12 months, you worried that your food would run out before you got the money to buy more.: Patient declined    Within the past 12 months, the food you bought just didn't last and you didn't have money to get more.: Patient declined   Transportation Needs: Patient Declined (07/21/2023)   Received from [facility] - Transportation    Lack of Transportation (Medical): Patient declined    Lack of Transportation (Non-Medical): Patient declined  Physical Activity: Unknown (07/21/2023)   Received from [facility]   Exercise Vital Sign    On average, how many days per week do you engage in moderate to strenuous exercise (like a brisk walk)?: Patient declined    Minutes of Exercise per Session: Not on file  Stress: Patient Declined (07/21/2023)   Received  from [facility] - Occupational Stress Questionnaire    Feeling of Stress : Patient declined  Social Connections: Patient Declined (07/21/2023)   Received from [facility]   Social Network    How would you rate your social network (family, work, friends)?: Patient declined  Intimate Partner Violence: Not At Risk (04/12/2022)   Received from [facility]   HITS    Over the last 12 months how often did your partner physically hurt you?: Never    Over the last 12 months how often did your partner insult you or talk down to you?: Never    Over the last 12 months how often did your partner threaten you with physical harm?: Never    Over the last 12 months how often did your partner scream or curse at you?: Never    Family History: Family History  Problem Relation Age of Onset   Diabetes Father    Hypertension Father    Hypertension Mother    Hypertension Maternal Grandmother    Diabetes Maternal Grandmother    Hypertension Maternal Grandfather    Diabetes Maternal Grandfather    Hypertension Paternal Grandmother    Diabetes Paternal Grandmother    Hypertension Paternal Grandfather    Diabetes Paternal Grandfather     Review of Systems: ROS  Physical Exam: Vital Signs BP 132/85 (BP Location: Left Arm, Patient Position: Sitting, Cuff Size: Large)   Ht 5' 6 (1.676 m)   Wt 290 lb 3.2 oz (131.6 kg)   SpO2  92%   BMI 46.84 kg/m   Physical Exam Constitutional:      General: Not in acute distress.    Appearance: Normal appearance. Not ill-appearing.  HENT:     Head: Normocephalic and atraumatic.  Eyes:     Pupils: Pupils are equal, round Neck:     Musculoskeletal: Normal range of motion.  Cardiovascular:     Rate and Rhythm: Normal rate    Pulses: Normal pulses.  Pulmonary:     Effort: Pulmonary effort is normal. No respiratory distress.  Abdominal:     General: Abdomen is flat. There is no distension.  Musculoskeletal: Normal range of motion.  Skin:    General: Skin is warm and dry.     Findings: No erythema or rash.  Neurological:     General: No focal deficit present.     Mental Status: Alert and oriented to person, place, and time. Mental status is at baseline.     Motor: No weakness.  Psychiatric:        Mood and Affect: Mood normal.        Behavior: Behavior normal.    Assessment/Plan: The patient is scheduled for panniculectomy with Dr. Lowery.  Risks, benefits, and alternatives of procedure discussed, questions answered and consent obtained.    Smoking Status: Non-smoker Counseling Given?  N/A  Caprini Score: 4; Risk Factors include: BMI greater than 40, and length of planned surgery. Recommendation for mechanical prophylaxis. Encourage early ambulation.   Pictures obtained: @consult   Post-op Rx sent to pharmacy: Oxycodone , Zofran   Patient was provided with the General Surgical Risk consent document and Pain Medication Agreement prior to their appointment.  They had adequate time to read through the risk consent documents and Pain Medication Agreement. We also discussed them in person together during this preop appointment. All of their questions were answered to their satisfaction.  Recommended calling if they have any further questions.  Risk consent form and  Pain Medication Agreement to be scanned into patient's chart.  The risk that can be encountered for  this procedure were discussed and include the following but not limited to these: asymmetry, fluid accumulation, firmness of the tissue, skin loss, decrease or no sensation, fat necrosis, bleeding, infection, healing delay.  Deep vein thrombosis, cardiac and pulmonary complications are risks to any procedure.  There are risks of anesthesia, changes to skin sensation and injury to nerves or blood vessels.  The muscle can be temporarily or permanently injured.  You may have an allergic reaction to tape, suture, glue, blood products which can result in skin discoloration, swelling, pain, skin lesions, poor healing.  Any of these can lead to the need for revisonal surgery or stage procedures.  Weight gain and weigh loss can also effect the long term appearance. The results are not guaranteed to last a lifetime.  Future surgery may be required.    Surgical clearance sent today, discussed with patient that if not received prior to surgery, surgery may need to be rescheduled.  Patient is also on a pain contract with PCP for Percocet 10, discussed with patient that no narcotics will be prescribed unless clearance is provided by PCP/pain management provider.  Patient is aware that if she continues to notice a worsening cough or any cold symptoms develop that surgery will need to be rescheduled and please notify our office.  Electronically signed by: Donnice PARAS Nalini Alcaraz, PA-C 08/31/2023 9:27 AM

## 2023-08-31 NOTE — Telephone Encounter (Signed)
 Faxed surgical clearance request to pain management/pcp, Alphonza Faison Jr, MD with urgency. Received fax success confirmation. Surgical spreadsheet updated.

## 2023-09-02 ENCOUNTER — Telehealth: Payer: Self-pay | Admitting: Plastic Surgery

## 2023-09-02 NOTE — Telephone Encounter (Signed)
 Patient called and is concerned about her surgery on 09/07/2023 because she has been experiencing a runny nose for approximately a week.  She has been taking cold medicine and stated that she does not have the flu or Covid. She has not had a fever.  She would like a call back to verify she will still be able to proceed with surgery.  She can be reached at [phone]

## 2023-09-05 NOTE — Telephone Encounter (Signed)
 Okay awesome, thanks Shannon Huffman!

## 2023-09-05 NOTE — Telephone Encounter (Signed)
 Patient scheduled for panniculectomy in 2 days with runny nose. Isaiah, do you mind calling patient today to see if she is having any other symptoms? Cough, congestion, body aches, breathing changes? It could be allergies, but may need to be postponed.  What do you think Dr. Lowery?

## 2023-09-06 ENCOUNTER — Telehealth: Payer: Self-pay | Admitting: Plastic Surgery

## 2023-09-06 MED ORDER — BUPIVACAINE LIPOSOME 1.3 % IJ SUSP
INTRAMUSCULAR | Status: AC
  Filled 2023-09-06: qty 20

## 2023-09-06 NOTE — Telephone Encounter (Signed)
 [place] [place] [facility] is on the phone about this pt, sx is tomorrow and they need to speak with yall about the pt sx clearances   [id number]   Panniculectomy ONLY / Ins / Sx 09-07-23 [facility] 1300  Isaiah OBIE Michalene Layvonne took the call   Adding sx schedulers to keep in loop

## 2023-09-06 NOTE — Telephone Encounter (Signed)
 Received signed clearance from requested provider. Please see document under Media tab.

## 2023-09-07 ENCOUNTER — Other Ambulatory Visit: Payer: Self-pay

## 2023-09-07 ENCOUNTER — Encounter (HOSPITAL_BASED_OUTPATIENT_CLINIC_OR_DEPARTMENT_OTHER)
Admission: RE | Disposition: A | Discharge status: Home / Self Care | Payer: Self-pay | Source: Home / Self Care | Attending: Plastic Surgery

## 2023-09-07 ENCOUNTER — Ambulatory Visit (HOSPITAL_BASED_OUTPATIENT_CLINIC_OR_DEPARTMENT_OTHER)
Admission: RE | Admit: 2023-09-07 | Discharge: 2023-09-07 | Disposition: A | Discharge status: Home / Self Care | Attending: Plastic Surgery | Admitting: Plastic Surgery

## 2023-09-07 ENCOUNTER — Encounter (HOSPITAL_BASED_OUTPATIENT_CLINIC_OR_DEPARTMENT_OTHER): Payer: Self-pay | Admitting: Plastic Surgery

## 2023-09-07 ENCOUNTER — Ambulatory Visit (HOSPITAL_BASED_OUTPATIENT_CLINIC_OR_DEPARTMENT_OTHER): Admitting: Anesthesiology

## 2023-09-07 DIAGNOSIS — Z6841 Body Mass Index (BMI) 40.0 and over, adult: Secondary | ICD-10-CM | POA: Diagnosis not present

## 2023-09-07 DIAGNOSIS — M546 Pain in thoracic spine: Secondary | ICD-10-CM | POA: Insufficient documentation

## 2023-09-07 DIAGNOSIS — I10 Essential (primary) hypertension: Secondary | ICD-10-CM

## 2023-09-07 DIAGNOSIS — G4730 Sleep apnea, unspecified: Secondary | ICD-10-CM | POA: Insufficient documentation

## 2023-09-07 DIAGNOSIS — M793 Panniculitis, unspecified: Secondary | ICD-10-CM | POA: Diagnosis present

## 2023-09-07 DIAGNOSIS — J45909 Unspecified asthma, uncomplicated: Secondary | ICD-10-CM

## 2023-09-07 DIAGNOSIS — Z9884 Bariatric surgery status: Secondary | ICD-10-CM | POA: Diagnosis not present

## 2023-09-07 DIAGNOSIS — G8929 Other chronic pain: Secondary | ICD-10-CM | POA: Insufficient documentation

## 2023-09-07 DIAGNOSIS — G40909 Epilepsy, unspecified, not intractable, without status epilepticus: Secondary | ICD-10-CM | POA: Diagnosis not present

## 2023-09-07 DIAGNOSIS — M542 Cervicalgia: Secondary | ICD-10-CM | POA: Insufficient documentation

## 2023-09-07 DIAGNOSIS — Z01818 Encounter for other preprocedural examination: Secondary | ICD-10-CM

## 2023-09-07 HISTORY — PX: PANNICULECTOMY: SHX5360

## 2023-09-07 LAB — POCT PREGNANCY, URINE: Preg Test, Ur: NEGATIVE

## 2023-09-07 SURGERY — PANNICULECTOMY
Anesthesia: General | Site: Abdomen | Laterality: Bilateral

## 2023-09-07 MED ORDER — HYDROMORPHONE HCL 1 MG/ML IJ SOLN
0.2500 mg | INTRAMUSCULAR | Status: DC | PRN
  Administered 2023-09-07 (×2): 0.25 mg via INTRAVENOUS

## 2023-09-07 MED ORDER — SUGAMMADEX SODIUM 200 MG/2ML IV SOLN
INTRAVENOUS | Status: DC | PRN
Start: 2023-09-07 — End: 2023-09-07
  Administered 2023-09-07: 400 mg via INTRAVENOUS

## 2023-09-07 MED ORDER — FENTANYL CITRATE (PF) 100 MCG/2ML IJ SOLN
INTRAMUSCULAR | Status: AC
Start: 2023-09-07 — End: 2023-09-07
  Filled 2023-09-07: qty 2

## 2023-09-07 MED ORDER — ONDANSETRON HCL 4 MG/2ML IJ SOLN
INTRAMUSCULAR | Status: AC
  Filled 2023-09-07: qty 2

## 2023-09-07 MED ORDER — LIDOCAINE HCL (PF) 1 % IJ SOLN
INTRAMUSCULAR | Status: AC
  Filled 2023-09-07: qty 60

## 2023-09-07 MED ORDER — HYDROMORPHONE HCL 1 MG/ML IJ SOLN
INTRAMUSCULAR | Status: DC | PRN
  Administered 2023-09-07 (×2): .5 mg via INTRAVENOUS

## 2023-09-07 MED ORDER — HYDROMORPHONE HCL 1 MG/ML IJ SOLN
INTRAMUSCULAR | Status: AC
  Filled 2023-09-07: qty 0.5

## 2023-09-07 MED ORDER — ACETAMINOPHEN 500 MG PO TABS
ORAL_TABLET | ORAL | Status: AC
Start: 2023-09-07 — End: 2023-09-07
  Filled 2023-09-07: qty 2

## 2023-09-07 MED ORDER — CEFAZOLIN SODIUM-DEXTROSE 3-4 GM/150ML-% IV SOLN
3.0000 g | INTRAVENOUS | Status: AC
  Administered 2023-09-07: 3 g via INTRAVENOUS

## 2023-09-07 MED ORDER — FENTANYL CITRATE (PF) 100 MCG/2ML IJ SOLN: 25.0000 ug | INTRAMUSCULAR | Status: DC | PRN

## 2023-09-07 MED ORDER — DEXMEDETOMIDINE HCL IN NACL 80 MCG/20ML IV SOLN
INTRAVENOUS | Status: AC
  Filled 2023-09-07: qty 20

## 2023-09-07 MED ORDER — LIDOCAINE-EPINEPHRINE 1 %-1:100000 IJ SOLN
INTRAMUSCULAR | Status: AC
  Filled 2023-09-07: qty 1

## 2023-09-07 MED ORDER — OXYCODONE HCL 5 MG PO TABS: 5.0000 mg | ORAL_TABLET | ORAL | Status: DC | PRN

## 2023-09-07 MED ORDER — PROPOFOL 10 MG/ML IV BOLUS
INTRAVENOUS | Status: DC | PRN
  Administered 2023-09-07: 200 mg via INTRAVENOUS

## 2023-09-07 MED ORDER — DEXAMETHASONE SODIUM PHOSPHATE 10 MG/ML IJ SOLN
INTRAMUSCULAR | Status: DC | PRN
Start: 2023-09-07 — End: 2023-09-07
  Administered 2023-09-07: 10 mg via INTRAVENOUS

## 2023-09-07 MED ORDER — SODIUM CHLORIDE 0.9% FLUSH
3.0000 mL | INTRAVENOUS | Status: DC | PRN
Start: 2023-09-07 — End: 2023-09-07

## 2023-09-07 MED ORDER — HYDROMORPHONE HCL 1 MG/ML IJ SOLN
INTRAMUSCULAR | Status: AC
Start: 2023-09-07 — End: 2023-09-07
  Filled 2023-09-07: qty 0.5

## 2023-09-07 MED ORDER — PHENYLEPHRINE 80 MCG/ML (10ML) SYRINGE FOR IV PUSH (FOR BLOOD PRESSURE SUPPORT)
PREFILLED_SYRINGE | INTRAVENOUS | Status: AC
  Filled 2023-09-07: qty 10

## 2023-09-07 MED ORDER — SUCCINYLCHOLINE CHLORIDE 200 MG/10ML IV SOSY
PREFILLED_SYRINGE | INTRAVENOUS | Status: AC
  Filled 2023-09-07: qty 10

## 2023-09-07 MED ORDER — TRANEXAMIC ACID-NACL 1000-0.7 MG/100ML-% IV SOLN
INTRAVENOUS | Status: DC | PRN
  Administered 2023-09-07: 1000 mg via INTRAVENOUS

## 2023-09-07 MED ORDER — SCOPOLAMINE 1 MG/3DAYS TD PT72
1.0000 | MEDICATED_PATCH | Freq: Once | TRANSDERMAL | Status: DC
  Administered 2023-09-07: 1.5 mg via TRANSDERMAL

## 2023-09-07 MED ORDER — SCOPOLAMINE 1 MG/3DAYS TD PT72
MEDICATED_PATCH | TRANSDERMAL | Status: AC
Start: 2023-09-07 — End: 2023-09-07
  Filled 2023-09-07: qty 1

## 2023-09-07 MED ORDER — LIDOCAINE 2% (20 MG/ML) 5 ML SYRINGE
INTRAMUSCULAR | Status: DC | PRN
  Administered 2023-09-07: 100 mg via INTRAVENOUS

## 2023-09-07 MED ORDER — LIDOCAINE 2% (20 MG/ML) 5 ML SYRINGE
INTRAMUSCULAR | Status: AC
  Filled 2023-09-07: qty 5

## 2023-09-07 MED ORDER — CHLORHEXIDINE GLUCONATE CLOTH 2 % EX PADS: 6.0000 | MEDICATED_PAD | Freq: Once | CUTANEOUS | Status: DC

## 2023-09-07 MED ORDER — CEFAZOLIN SODIUM-DEXTROSE 3-4 GM/150ML-% IV SOLN
INTRAVENOUS | Status: AC
  Filled 2023-09-07: qty 150

## 2023-09-07 MED ORDER — DEXAMETHASONE SODIUM PHOSPHATE 10 MG/ML IJ SOLN
INTRAMUSCULAR | Status: AC
  Filled 2023-09-07: qty 1

## 2023-09-07 MED ORDER — ACETAMINOPHEN 325 MG PO TABS: 650.0000 mg | ORAL_TABLET | ORAL | Status: DC | PRN

## 2023-09-07 MED ORDER — FENTANYL CITRATE (PF) 100 MCG/2ML IJ SOLN
INTRAMUSCULAR | Status: DC | PRN
  Administered 2023-09-07: 100 ug via INTRAVENOUS
  Administered 2023-09-07 (×2): 50 ug via INTRAVENOUS

## 2023-09-07 MED ORDER — SODIUM CHLORIDE 0.9% FLUSH: 3.0000 mL | Freq: Two times a day (BID) | INTRAVENOUS | Status: DC

## 2023-09-07 MED ORDER — ONDANSETRON HCL 4 MG/2ML IJ SOLN
INTRAMUSCULAR | Status: DC | PRN
  Administered 2023-09-07: 4 mg via INTRAVENOUS

## 2023-09-07 MED ORDER — ACETAMINOPHEN 500 MG PO TABS
1000.0000 mg | ORAL_TABLET | Freq: Once | ORAL | Status: AC
  Administered 2023-09-07: 1000 mg via ORAL

## 2023-09-07 MED ORDER — EPHEDRINE 5 MG/ML INJ
INTRAVENOUS | Status: AC
  Filled 2023-09-07: qty 5

## 2023-09-07 MED ORDER — DROPERIDOL 2.5 MG/ML IJ SOLN: 0.6250 mg | Freq: Once | INTRAMUSCULAR | Status: DC | PRN

## 2023-09-07 MED ORDER — LIDOCAINE-EPINEPHRINE 1 %-1:100000 IJ SOLN
INTRAMUSCULAR | Status: DC | PRN
  Administered 2023-09-07: 20 mL via INTRAMUSCULAR

## 2023-09-07 MED ORDER — FENTANYL CITRATE (PF) 100 MCG/2ML IJ SOLN
INTRAMUSCULAR | Status: AC
  Filled 2023-09-07: qty 2

## 2023-09-07 MED ORDER — MIDAZOLAM HCL 5 MG/5ML IJ SOLN
INTRAMUSCULAR | Status: DC | PRN
  Administered 2023-09-07: 2 mg via INTRAVENOUS

## 2023-09-07 MED ORDER — MIDAZOLAM HCL 2 MG/2ML IJ SOLN
INTRAMUSCULAR | Status: AC
  Filled 2023-09-07: qty 2

## 2023-09-07 MED ORDER — BUPIVACAINE LIPOSOME 1.3 % IJ SUSP
INTRAMUSCULAR | Status: DC | PRN
  Administered 2023-09-07: 40 mL

## 2023-09-07 MED ORDER — SODIUM CHLORIDE 0.9 % IV SOLN
250.0000 mL | INTRAVENOUS | Status: DC | PRN
Start: 2023-09-07 — End: 2023-09-07

## 2023-09-07 MED ORDER — ATROPINE SULFATE 0.4 MG/ML IV SOLN
INTRAVENOUS | Status: AC
  Filled 2023-09-07: qty 1

## 2023-09-07 MED ORDER — ACETAMINOPHEN 325 MG RE SUPP: 650.0000 mg | RECTAL | Status: DC | PRN

## 2023-09-07 MED ORDER — ROCURONIUM BROMIDE 100 MG/10ML IV SOLN
INTRAVENOUS | Status: DC | PRN
  Administered 2023-09-07: 70 mg via INTRAVENOUS

## 2023-09-07 MED ORDER — LACTATED RINGERS IV SOLN: INTRAVENOUS | Status: DC

## 2023-09-07 MED ORDER — PROPOFOL 500 MG/50ML IV EMUL
INTRAVENOUS | Status: AC
  Filled 2023-09-07: qty 100

## 2023-09-07 MED ORDER — ROCURONIUM BROMIDE 10 MG/ML (PF) SYRINGE
PREFILLED_SYRINGE | INTRAVENOUS | Status: AC
  Filled 2023-09-07: qty 10

## 2023-09-07 MED ORDER — VASHE WOUND IRRIGATION OPTIME
TOPICAL | Status: DC | PRN
  Administered 2023-09-07: 34 [oz_av]

## 2023-09-07 SURGICAL SUPPLY — 60 items
BAG DECANTER FOR FLEXI CONT (MISCELLANEOUS) IMPLANT
BINDER ABDOMINAL 10 UNV 27-48 (MISCELLANEOUS) IMPLANT
BINDER ABDOMINAL 12 SM 30-45 (SOFTGOODS) IMPLANT
BIOPATCH RED 1 DISK 7.0 (GAUZE/BANDAGES/DRESSINGS) IMPLANT
BLADE CLIPPER SURG (BLADE) ×1 IMPLANT
BLADE HEX COATED 2.75 (ELECTRODE) ×1 IMPLANT
BLADE SURG 10 STRL SS (BLADE) ×2 IMPLANT
BLADE SURG 15 STRL LF DISP TIS (BLADE) ×1 IMPLANT
CANISTER SUCT 1200ML W/VALVE (MISCELLANEOUS) ×1 IMPLANT
CLIP APPLIE 9.375 MED OPEN (MISCELLANEOUS) IMPLANT
COVER BACK TABLE 60X90IN (DRAPES) ×1 IMPLANT
COVER MAYO STAND STRL (DRAPES) ×1 IMPLANT
DERMABOND ADVANCED .7 DNX12 (GAUZE/BANDAGES/DRESSINGS) ×2 IMPLANT
DRAIN CHANNEL 19F RND (DRAIN) IMPLANT
DRAPE LAPAROSCOPIC ABDOMINAL (DRAPES) ×1 IMPLANT
DRESSING MEPILEX FLEX 4X4 (GAUZE/BANDAGES/DRESSINGS) IMPLANT
DRESSING MORCELLS FINE 1000 (Tissue) IMPLANT
DRSG MEPILEX POST OP 4X8 (GAUZE/BANDAGES/DRESSINGS) ×2 IMPLANT
DRSG TEGADERM 2-3/8X2-3/4 SM (GAUZE/BANDAGES/DRESSINGS) ×2 IMPLANT
DRSG WOUND ADHESIVE SYLKE 010 (GAUZE/BANDAGES/DRESSINGS) IMPLANT
ELECTRODE BLDE 4.0 EZ CLN MEGD (MISCELLANEOUS) ×1 IMPLANT
ELECTRODE REM PT RTRN 9FT ADLT (ELECTROSURGICAL) ×1 IMPLANT
EVACUATOR SILICONE 100CC (DRAIN) IMPLANT
GAUZE PAD ABD 8X10 STRL (GAUZE/BANDAGES/DRESSINGS) ×2 IMPLANT
GAUZE SPONGE 2X2 STRL 8-PLY (GAUZE/BANDAGES/DRESSINGS) IMPLANT
GAUZE SPONGE 4X4 12PLY STRL (GAUZE/BANDAGES/DRESSINGS) IMPLANT
GLOVE BIO SURGEON STRL SZ 6.5 (GLOVE) ×2 IMPLANT
GLOVE BIOGEL PI IND STRL 7.0 (GLOVE) ×1 IMPLANT
GOWN STRL REUS W/ TWL LRG LVL3 (GOWN DISPOSABLE) ×2 IMPLANT
LINER CANISTER 1000CC FLEX (MISCELLANEOUS) IMPLANT
NDL HYPO 25X1 1.5 SAFETY (NEEDLE) ×1 IMPLANT
NDL SAFETY ECLIPSE 18X1.5 (NEEDLE) IMPLANT
NEEDLE HYPO 25X1 1.5 SAFETY (NEEDLE) ×1 IMPLANT
NS IRRIG 1000ML POUR BTL (IV SOLUTION) ×1 IMPLANT
PACK BASIN DAY SURGERY FS (CUSTOM PROCEDURE TRAY) ×1 IMPLANT
PAD FOAM SILICONE BACKED (GAUZE/BANDAGES/DRESSINGS) ×1 IMPLANT
PENCIL SMOKE EVACUATOR (MISCELLANEOUS) ×1 IMPLANT
PIN SAFETY STERILE (MISCELLANEOUS) IMPLANT
SLEEVE SCD COMPRESS KNEE MED (STOCKING) ×1 IMPLANT
SPONGE T-LAP 18X18 ~~LOC~~+RFID (SPONGE) ×2 IMPLANT
STRIP SUTURE WOUND CLOSURE 1/2 (MISCELLANEOUS) ×2 IMPLANT
SUT MNCRL AB 4-0 PS2 18 (SUTURE) ×2 IMPLANT
SUT MON AB 3-0 SH27 (SUTURE) ×2 IMPLANT
SUT MON AB 5-0 PS2 18 (SUTURE) IMPLANT
SUT PDS II 3-0 CT2 27 ABS (SUTURE) ×5 IMPLANT
SUT SILK 2 0 SH (SUTURE) IMPLANT
SUT SILK 3 0 SH 30 (SUTURE) IMPLANT
SUT VIC AB 3-0 PS2 18XBRD (SUTURE) IMPLANT
SUT VIC AB 4-0 PS2 18 (SUTURE) ×2 IMPLANT
SYR 50ML LL SCALE MARK (SYRINGE) IMPLANT
SYR BULB IRRIG 60ML STRL (SYRINGE) ×1 IMPLANT
SYR CONTROL 10ML LL (SYRINGE) ×1 IMPLANT
SYR TB 1ML LL NO SAFETY (SYRINGE) IMPLANT
TOWEL GREEN STERILE FF (TOWEL DISPOSABLE) ×2 IMPLANT
TRAY DSU PREP LF (CUSTOM PROCEDURE TRAY) ×1 IMPLANT
TUBE CONNECTING 20X1/4 (TUBING) ×1 IMPLANT
TUBING INFILTRATION IT-10001 (TUBING) IMPLANT
TUBING SET GRADUATE ASPIR 12FT (MISCELLANEOUS) IMPLANT
UNDERPAD 30X36 HEAVY ABSORB (UNDERPADS AND DIAPERS) ×2 IMPLANT
YANKAUER SUCT BULB TIP NO VENT (SUCTIONS) ×1 IMPLANT

## 2023-09-07 NOTE — Anesthesia Preprocedure Evaluation (Signed)
 Anesthesia Evaluation  Patient identified by MRN, date of birth, ID band Patient awake    Reviewed: Allergy & Precautions, NPO status , Patient's Chart, lab work & pertinent test results  History of Anesthesia Complications Negative for: history of anesthetic complications  Airway Mallampati: II  TM Distance: >3 FB Neck ROM: Full    Dental  (+)    Pulmonary asthma , sleep apnea    Pulmonary exam normal        Cardiovascular hypertension, Pt. on medications Normal cardiovascular exam     Neuro/Psych Seizures - (years ago), Well Controlled,   negative psych ROS   GI/Hepatic negative GI ROS, Neg liver ROS,,,  Endo/Other    Class 3 obesity  Renal/GU negative Renal ROS  negative genitourinary   Musculoskeletal negative musculoskeletal ROS (+)    Abdominal   Peds  Hematology negative hematology ROS (+)   Anesthesia Other Findings Day of surgery medications reviewed with patient.  Reproductive/Obstetrics negative OB ROS                              Anesthesia Physical Anesthesia Plan  ASA: 3  Anesthesia Plan: General   Post-op Pain Management: Tylenol  PO (pre-op)*   Induction: Intravenous  PONV Risk Score and Plan: 3 and Treatment may vary due to age or medical condition, Ondansetron , Dexamethasone , Midazolam  and Scopolamine  patch - Pre-op  Airway Management Planned: Oral ETT  Additional Equipment: None  Intra-op Plan:   Post-operative Plan: Extubation in OR  Informed Consent: I have reviewed the patients History and Physical, chart, labs and discussed the procedure including the risks, benefits and alternatives for the proposed anesthesia with the patient or authorized representative who has indicated his/her understanding and acceptance.     Dental advisory given  Plan Discussed with: CRNA  Anesthesia Plan Comments:          Anesthesia Quick Evaluation

## 2023-09-07 NOTE — Op Note (Signed)
 Operative Report  Date of operation: 09/07/2023  Patient: Shannon Huffman, MRN: [id number], 38 y.o. female.   Date of birth: 03/06/1985  Location: [facility]  Preoperative Diagnosis: Panniculitis  Postoperative Diagnosis:  Same  Procedure: Panniculectomy  Surgeon:  Dr. Estefana Fritter  Assistant:  Honora Seip, PA  Anesthesia:  General  EBL:  50 cc  Drains:  2 number 19 blake round drains  Condition:  Stable  Complications: None  Disposition: Recovery Room  Procedure in Detail: Patient was seen the morning of her surgery and marked out for the procedure. She was then given an IV and IV antibiotics. The patient was taken to the operating room and underwent general anesthesia.  A time out was called and all information was confirmed to be correct. SCD's and a pillow under the knees was in place. The patient was then prepped and draped in the standard sterile fashion. Local was placed into the incision site. The flanks were liposuctioned and 450 cc removed from the sides. The lower incision was incised and the incision taken down through the Scarpa's fascia to the rectus abdominus fascia. The skin and subcutaneous tissue was then lifted off the fascia up to the level of the umbilicus.   The patient was then flexed on the table and the amount that could be excised was confirmed. The pannus was excised and it weighed 4400 grams.  The mons was also suspended with 3-0 Monocryl.  Two #19 blake round drains were placed and secured with 3-0 Silk. Experel was placed and Myriad was placed. The abdominal wall was closed with buried 3-0 PDS and subcuticular 3-0 Monocryl.    The wound was then dressed with sylke,  ABD's and an abdominal binder were placed. Patient was allowed to wake up, extubated and taken on a stretcher in the flexed position to the recovery room. Family was notified at the end of the case.   The advanced practice practitioner (APP) assisted  throughout the case.  The APP was essential in retraction and counter traction when needed to make the case progress smoothly.  This retraction and assistance made it possible to see the tissue plans for the procedure.  The assistance was needed for blood control, tissue re-approximation and assisted with closure of the incision site.

## 2023-09-07 NOTE — Discharge Instructions (Addendum)
 INSTRUCTIONS FOR AFTER ABDOMINAL SURGERY  You will likely have some questions about what to expect following your operation.  The following information will help you and your family understand what to expect when you get home.  Following these guidelines will help ensure a smooth recovery and reduce risks of complications.  Postoperative instructions include information on: diet, wound care, medications and physical activity.  AFTER SURGERY Expect to go home after the procedure.  In some cases, you may need to spend one night in the hospital for observation.  DIET This surgery does not require a specific diet.  However, the healthier you eat the better your body can heal. It is important to increasing your protein intake.  Limit foods with high sugar and  carbohydrate content.  Focus on vegetables, meat and other protein sources if you are vegan or vegetarian.  If you undergo liposuction during your procedure it is very important to drink 8 oz of water every hour while awake for 2 days.  If your urine is bright yellow, then it is concentrated, and you need to drink more water.  If you find you are persistently nauseated or unable to take in liquids let us  know.  NO TOBACCO USE or EXPOSURE.  This will slow your healing process and increase the risk of a wound.  WOUND CARE Leave the abdominal binder in place for 3 days.  Then you can remove it and shower.  Replace the binder or spanx after your shower.   You may have Topifoam or Lipofoam on.  It is soft and spongy and helps keep you from getting creases if you have liposuction.  This can be removed before the shower and then replaced.  If you need more it is available on [employer] as lipofoam.  If you have steri-strips / tape directly attached to your skin leave them in place. It is OK to get these wet.  No baths, pools or hot tubs for four weeks. We close your incision to leave the smallest and best-looking scar. No ointment or creams on your incisions  until cleared by your surgeon.  No Neosporin (Too many skin reactions with this one).  After the steri-strips are off can use Mederma or Skinuva and start massaging the scar. Continue to wear the binder/spanx or Ace wrap around the clock, including while sleeping, for 6 weeks. This provides added comfort and helps reduce the fluid accumulation at the surgery site.  ACTIVITY No heavy lifting until cleared by the doctor.  For example, no more than a half-gallon of milk.  It is OK to walk and you are encouraged to move your legs to help decrease your risk of getting a blood clot.  It will also help keep you from getting deconditioned.  Every 1 to 2 hours get up and walk for 5 minutes. This will help with a quicker recovery back to normal.  Let pain be your guide so you don't do too much.     SLEEPING / RESTING Sleeping and resting should be in the jack-knife or bent forward position with your head elevated.  This will help reduce pulling on your abdominal incision.  You can elevate your head and upper back with a few pillows and place a pillow under your knees.  Avoid stomach sleeping for 3 months.   WORK Everyone returns to work at different times. As a rough guide, most people take 1 - 2 weeks off prior to returning to work. If you need documentation for your  job give them to the front staff for processing.  DRIVING Arrange for someone to bring you home from the hospital.  You may be able to drive a few days after surgery but not while taking any narcotics or valium.  This is for your safety as well as others sharing the road with you.  BOWEL MOVEMENTS Constipation can occur after anesthesia and while taking pain medication.  It is important to stay ahead for your comfort.  We recommend taking Milk of Magnesia (2 tablespoons; twice a day) while taking the pain pills.  MEDICATIONS (you may receive and should be started after surgery) At your preoperative visit for you history and physical you were  given the following medications: Antibiotic: Start this medication when you get home and take according to the instructions on the bottle. Zofran  4 mg:  This is to treat nausea and vomiting.  You can take this every 6 hours as needed and only if needed. Norco (hydrocodone/acetaminophen ) 5/325 mg:  This is only to be used after you have taken the Motrin  or the Tylenol . Every 8 hours as needed.  Over the counter Medication to take: Ibuprofen  (Motrin ) 600 mg:  Take this every 6 hours.  If you have additional pain then take 500 mg of the Tylenol .  Only take the Norco after you have tried these two. MiraLAX or stool softener of choice: Take this according to the bottle if you take the Norco.  WHEN TO CALL Call your surgeon's office if any of the following occur:  Fever 101 degrees F or greater  Excessive bleeding or fluid from the incision site.  Pain that increases over time without aid from the medications  Redness, warmth, or pus draining from incision sites  Persistent nausea or inability to take in liquids  Severe misshapen area that underwent the operation.  May take Tylenol  after 6:15 pm, if needed.   Information for Discharge Teaching: EXPAREL  (bupivacaine  liposome injectable suspension)   Pain relief is important to your recovery. The goal is to control your pain so you can move easier and return to your normal activities as soon as possible after your procedure. Your physician may use several types of medicines to manage pain, swelling, and more.  Your surgeon or anesthesiologist gave you EXPAREL (bupivacaine ) to help control your pain after surgery.  EXPAREL  is a local anesthetic designed to release slowly over an extended period of time to provide pain relief by numbing the tissue around the surgical site. EXPAREL  is designed to release pain medication over time and can control pain for up to 72 hours. Depending on how you respond to EXPAREL , you may require less pain medication  during your recovery. EXPAREL  can help reduce or eliminate the need for opioids during the first few days after surgery when pain relief is needed the most. EXPAREL  is not an opioid and is not addictive. It does not cause sleepiness or sedation.   Important! A teal colored band has been placed on your arm with the date, time and amount of EXPAREL  you have received. Please leave this armband in place for the full 96 hours following administration, and then you may remove the band. If you return to the hospital for any reason within 96 hours following the administration of EXPAREL , the armband provides important information that your health care providers to know, and alerts them that you have received this anesthetic.    Possible side effects of EXPAREL : Temporary loss of sensation or ability to move in  the area where medication was injected. Nausea, vomiting, constipation Rarely, numbness and tingling in your mouth or lips, lightheadedness, or anxiety may occur. Call your doctor right away if you think you may be experiencing any of these sensations, or if you have other questions regarding possible side effects.  Follow all other discharge instructions given to you by your surgeon or nurse. Eat a healthy diet and drink plenty of water or other fluids.  About my Jackson-Pratt Bulb Drain  What is a Jackson-Pratt bulb? A Jackson-Pratt is a soft, round device used to collect drainage. It is connected to a long, thin drainage catheter, which is held in place by one or two small stiches near your surgical incision site. When the bulb is squeezed, it forms a vacuum, forcing the drainage to empty into the bulb.  Emptying the Jackson-Pratt bulb- To empty the bulb: 1. Release the plug on the top of the bulb. 2. Pour the bulb's contents into a measuring container which your nurse will provide. 3. Record the time emptied and amount of drainage. Empty the drain(s) as often as your     doctor or nurse  recommends.  Date                  Time                    Amount (Drain 1)                 Amount (Drain 2)  _____________________________________________________________________  _____________________________________________________________________  _____________________________________________________________________  _____________________________________________________________________  _____________________________________________________________________  _____________________________________________________________________  _____________________________________________________________________  _____________________________________________________________________  Squeezing the Jackson-Pratt Bulb- To squeeze the bulb: 1. Make sure the plug at the top of the bulb is open. 2. Squeeze the bulb tightly in your fist. You will hear air squeezing from the bulb. 3. Replace the plug while the bulb is squeezed. 4. Use a safety pin to attach the bulb to your clothing. This will keep the catheter from     pulling at the bulb insertion site.  When to call your doctor- Call your doctor if: Drain site becomes red, swollen or hot. You have a fever greater than 101 degrees F. There is oozing at the drain site. Drain falls out (apply a guaze bandage over the drain hole and secure it with tape). Drainage increases daily not related to activity patterns. (You will usually have more drainage when you are active than when you are resting.) Drainage has a bad odor.

## 2023-09-07 NOTE — Transfer of Care (Signed)
 Immediate Anesthesia Transfer of Care Note  Patient: Shannon Huffman  Procedure(s) Performed: PANNICULECTOMY (Bilateral: Abdomen)  Patient Location: PACU  Anesthesia Type:General  Level of Consciousness: drowsy  Airway & Oxygen Therapy: Patient Spontanous Breathing and Patient connected to face mask oxygen  Post-op Assessment: Report given to RN and Post -op Vital signs reviewed and stable  Post vital signs: Reviewed and stable  Last Vitals:  Vitals Value Taken Time  BP 147/95 09/07/23 15:45  Temp    Pulse 115 09/07/23 15:46  Resp 15 09/07/23 15:46  SpO2 100 % 09/07/23 15:46  Vitals shown include unfiled device data.  Last Pain:  Vitals:   09/07/23 1056  TempSrc: Temporal  PainSc: 0-No pain         Complications: No notable events documented.

## 2023-09-07 NOTE — Anesthesia Procedure Notes (Signed)
 Procedure Name: Intubation Date/Time: 09/07/2023 1:19 PM  Performed by: Debarah Chiquita LABOR, CRNAPre-anesthesia Checklist: Patient identified, Emergency Drugs available, Suction available and Patient being monitored Patient Re-evaluated:Patient Re-evaluated prior to induction Oxygen Delivery Method: Circle system utilized Preoxygenation: Pre-oxygenation with 100% oxygen Induction Type: IV induction Ventilation: Mask ventilation without difficulty and Oral airway inserted - appropriate to patient size Laryngoscope Size: Mac and 3 Grade View: Grade I Tube type: Oral Tube size: 7.0 mm Number of attempts: 1 Airway Equipment and Method: Stylet, Bite block and Oral airway Placement Confirmation: ETT inserted through vocal cords under direct vision, positive ETCO2 and breath sounds checked- equal and bilateral Secured at: 22 cm Tube secured with: Tape Dental Injury: Teeth and Oropharynx as per pre-operative assessment

## 2023-09-07 NOTE — Interval H&P Note (Signed)
 History and Physical Interval Note:  09/07/2023 12:27 PM  Shannon Huffman  has presented today for surgery, with the diagnosis of Panniculitis.  The various methods of treatment have been discussed with the patient and family. After consideration of risks, benefits and other options for treatment, the patient has consented to  Procedure(s): PANNICULECTOMY (Bilateral) as a surgical intervention.  The patient's history has been reviewed, patient examined, no change in status, stable for surgery.  I have reviewed the patient's chart and labs.  Questions were answered to the patient's satisfaction.     Estefana RAMAN [name]

## 2023-09-07 NOTE — Anesthesia Postprocedure Evaluation (Signed)
 Anesthesia Post Note  Patient: Shannon Huffman  Procedure(s) Performed: PANNICULECTOMY (Bilateral: Abdomen)     Patient location during evaluation: PACU Anesthesia Type: General Level of consciousness: awake and alert Pain management: pain level controlled Vital Signs Assessment: post-procedure vital signs reviewed and stable Respiratory status: spontaneous breathing, nonlabored ventilation and respiratory function stable Cardiovascular status: blood pressure returned to baseline Postop Assessment: no apparent nausea or vomiting Anesthetic complications: no   No notable events documented.  Last Vitals:  Vitals:   09/07/23 1600 09/07/23 1615  BP: (!) 145/101 (!) 147/99  Pulse: (!) 105 100  Resp: 10 10  Temp:    SpO2: 99% 99%    Last Pain:  Vitals:   09/07/23 1615  TempSrc:   PainSc: Asleep                 Vertell Row

## 2023-09-08 ENCOUNTER — Encounter (HOSPITAL_BASED_OUTPATIENT_CLINIC_OR_DEPARTMENT_OTHER): Payer: Self-pay | Admitting: Plastic Surgery

## 2023-09-13 ENCOUNTER — Telehealth: Payer: Self-pay | Admitting: Physician Assistant

## 2023-09-13 NOTE — Telephone Encounter (Signed)
 Pt wants someone to call her, she is asking if she can drive PostOP: Panniculectomy ONLY / Ins / Sx 09-07-23 [facility] 1300 or does she need someone to drive for her

## 2023-09-13 NOTE — Progress Notes (Unsigned)
 Patient is a pleasant 38 year old female s/p panniculectomy performed 09/07/2023 Dr. Lowery who presents to clinic for postoperative follow-up.  Reviewed operative report and 4400 gram pannus was excised at time of excision.  The area was closed with Monocryl.  Today,

## 2023-09-14 ENCOUNTER — Encounter: Payer: Self-pay | Admitting: Physician Assistant

## 2023-09-14 ENCOUNTER — Ambulatory Visit: Admitting: Physician Assistant

## 2023-09-14 VITALS — BP 129/83 | HR 96 | Ht 66.0 in | Wt 273.0 lb

## 2023-09-14 DIAGNOSIS — Z9889 Other specified postprocedural states: Secondary | ICD-10-CM

## 2023-09-15 ENCOUNTER — Ambulatory Visit (INDEPENDENT_AMBULATORY_CARE_PROVIDER_SITE_OTHER): Admitting: Physician Assistant

## 2023-09-15 ENCOUNTER — Encounter: Admitting: Physician Assistant

## 2023-09-15 ENCOUNTER — Telehealth: Payer: Self-pay | Admitting: Plastic Surgery

## 2023-09-15 VITALS — BP 129/84 | HR 97

## 2023-09-15 DIAGNOSIS — Z9889 Other specified postprocedural states: Secondary | ICD-10-CM

## 2023-09-15 NOTE — Telephone Encounter (Signed)
 Patient says that she is swelling on her right side and its hurting and yesterday she was here for a post op, she says it didn't look like that, please reach out and advise i

## 2023-09-15 NOTE — Progress Notes (Signed)
 Patient is a pleasant 38 year old female s/p panniculectomy performed 09/07/2023 Dr. Lowery who presents to clinic for postoperative follow-up.   She was seen here in office yesterday.  At that time, she was doing excellent from a postoperative standpoint.  Drains were not removed due to elevated volumes.  However, output was still entirely reassuring as well as her exam.    She then called in today saying that she woke up and noticed some new swelling on the right side of her incision.  She also states that she has some numbness at the panniculectomy incision site, mostly medially.  She wanted to make sure everything looked okay.  She denies any significant pain, fevers, overlying skin changes, or other concerns.  On exam, abdomen is soft and nondistended.  No TTP.  Soft throughout.  No palpable underlying fluid collection concerning for seroma or hematoma.  Consistent with yesterday's exam.  Discussed that possibly was related to her binder and how it was positioned.  She had suspected that this may have been contributing factor.  Drain output is still entirely reassuring.  Will plan for removal of one of her drains, likely her left 1, end of next week if output is appropriately low.  Will leave the remaining drain in place for at least another week.  Return as scheduled.  She will call the office if she has questions or concerns in interim.

## 2023-09-15 NOTE — Telephone Encounter (Signed)
 Just spoke to patient. She says that it's not hurting and describes it as a speed bump along the right side of her panniculectomy incision. She is going to take a picture and upload it to MyChart so that I can take a look. My schedule looks packed, but it looks like Dr. Lowery is in clinic until 130 so perhaps she can get added around then if needed.

## 2023-09-23 ENCOUNTER — Ambulatory Visit: Admitting: Plastic Surgery

## 2023-09-23 DIAGNOSIS — M793 Panniculitis, unspecified: Secondary | ICD-10-CM

## 2023-09-23 NOTE — Progress Notes (Signed)
 The patient is a 38 year old female here for follow-up after undergoing a panniculectomy 2 weeks ago.  She was a little bit worried about swelling.  Everything looks good and she is draining nicely.  It does not look like there is any hematoma or seroma.  She does not want the drains out yet which is reasonable.  Continue with the binder or a spanks and lipo foam.  Follow-up in 1 week.

## 2023-09-27 ENCOUNTER — Encounter: Admitting: Physician Assistant

## 2023-09-28 NOTE — Progress Notes (Addendum)
 Patient is a 38 year old female who underwent panniculectomy with Dr. Lowery on 09/07/2023.  Patient is about 3 weeks postop.  She presents to the clinic today for postoperative follow-up.  Patient was last seen in the clinic on 09/23/2023.  At this visit, everything was looking good and patient was draining nicely.  It did not look like there is any hematoma or seroma.  Plan was for patient to continue with binder and to follow-up in 1 week.  Today, patient reports she is overall doing well.  She states that she noticed some swelling in her lower extremities over the past few days.  She states it started about 3 days ago, and it started with both of her leg swelling, but then continued with just her left leg swelling.  She states that it seems to resolve when she elevates her her legs.  She denies any chest pain or shortness of breath.  She denies any pain to her left lower extremity.  She reports that she has been up and ambulating.    In terms of her surgical site, she reports things are going well.  She denies any new issues or concerns.  She reports that her drains have put out about 25 to 30 cc in the past 24 hours and then drained nothing over several days prior to that.  She does not report any fevers or chills.  Chaperone present on exam.  On exam, patient is sitting upright in no acute distress.  No respiratory distress or unlabored breathing.  Abdomen is soft and nontender.  There is no overlying erythema.  No obvious fluid collection on exam.  Sylke dressing is in place over the incision.  This was removed.  Incision appears to be intact and healing well.  JP drains are intact with minimal serous drainage in each of the bulbs.  There are no signs of infection on exam.  To the lower extremities, calves were symmetric.  There is no overlying  erythema to either leg, no tenderness to palpation.  Right JP drain was removed without any difficulty.  Patient tolerated well.  I discussed with the  patient that although my suspicion is low for DVT, given the history of recent surgery and unilateral leg swelling, we should obtain an ultrasound to rule out DVT.  She expressed understanding.  I discussed with the patient that if she develops any chest pain, shortness of breath, worsening swelling to the left lower extremity, pain to the left lower extremity, she needs to go to the emergency room.  She expressed understanding.  I discussed with the patient that her right drain site may drain over the next few days.  I recommended that she apply gauze and tape over the drain site.  I discussed with her that starting early next week, she may apply a little bit of Vaseline over the drain site along with the gauze.  I discussed with her that if the gauze becomes saturated, she should go ahead and change it.  Patient expressed understanding.  I discussed with the patient the importance of wearing compression at all times and avoiding strenuous activities.  She expressed understanding.  I recommended that patient apply Vaseline to her incision daily.  Patient to follow back up later next week or early the following week.  I instructed her to call if she has any questions or concerns about anything.

## 2023-09-29 ENCOUNTER — Ambulatory Visit: Admitting: Student

## 2023-09-29 ENCOUNTER — Telehealth: Payer: Self-pay | Admitting: Student

## 2023-09-29 VITALS — BP 117/81

## 2023-09-29 DIAGNOSIS — Z9889 Other specified postprocedural states: Secondary | ICD-10-CM

## 2023-09-29 NOTE — Telephone Encounter (Signed)
 I called Felicia at vascular back, discussed with her that she can go ahead and schedule the patient for tomorrow for ultrasound.  I also adjusted the order so that if patient is positive, she may go to the DVT clinic.  I did call the patient and let her know that the plan was for the ultrasound to be scheduled for tomorrow.  I discussed with her that if she develops any symptoms including chest pain, shortness of breath, worsening leg swelling, pain or has any concerning symptoms she needs to go to the emergency room immediately.  She expressed understanding.

## 2023-09-29 NOTE — Telephone Encounter (Signed)
 [facility] called regarding an order you placed for this patient for Venous U/S.  She does not have any availability today and wanted to know if tomorrow 09/30/23 would be ok.  Also if the patient is positive, would you like the patient treated at the DVT Clinic or returned to our office?  If you would like the patient treated at the DVT clinic, please update the order and mark yes on the order under Diagnosis where is asks if the patient should be treated at the DVT clinic.  If you have any questions, Bobbette can be reached at [phone].

## 2023-09-29 NOTE — Addendum Note (Signed)
 Addended by: ANDRIS STAGGER on: 09/29/2023 11:56 AM   Modules accepted: Orders

## 2023-09-30 ENCOUNTER — Ambulatory Visit (HOSPITAL_COMMUNITY)
Admission: RE | Admit: 2023-09-30 | Discharge: 2023-09-30 | Disposition: A | Discharge status: Home / Self Care | Source: Ambulatory Visit | Attending: Student | Admitting: Student

## 2023-09-30 ENCOUNTER — Ambulatory Visit: Payer: Self-pay | Admitting: Student

## 2023-09-30 ENCOUNTER — Ambulatory Visit: Admitting: Student

## 2023-09-30 VITALS — BP 123/84

## 2023-09-30 DIAGNOSIS — Z9889 Other specified postprocedural states: Secondary | ICD-10-CM | POA: Diagnosis present

## 2023-09-30 NOTE — Telephone Encounter (Signed)
 I called and discussed the preliminary results of the ultrasound study with the patient.  I did discuss with her that these were preliminary results.  She expressed understanding.

## 2023-09-30 NOTE — Telephone Encounter (Signed)
 I spoke with the vascular studies team and they stated that if the preliminary result is negative, patient is negative for DVT.

## 2023-09-30 NOTE — Progress Notes (Signed)
 Patient is a 38 year old female who underwent panniculectomy with Dr. Lowery on 09/07/2023.  Patient is about 3 weeks postop.  Patient presents to the clinic today with concerns about her left drain.     Patient was seen in the clinic yesterday.  On exam, her abdomen was soft and tender.  There were no obvious fluid collections on exam.  Incision was intact and healing well.  Right JP drain was removed without difficulty due to low output.  DVT study was also ordered as patient had reported left lower extremity swelling.  Today, patient reports she is doing well.  She states that her drain today has not been holding suction.  She states that she has been also experiencing some drainage around her drain site.  Patient states that she has an appointment with ultrasound later this afternoon.  She denies any new chest pain or shortness of breath.  Denies any other issues or concerns at this time.  Chaperone present on exam.  On exam, patient is sitting upright in no acute distress.  Abdomen is soft and nontender.  There is no overlying erythema.  No obvious fluid collections on exam.  Incision appears to be intact.  Right drain site appears to have some slough noted in it.  There is no active drainage or surrounding erythema.  The suture to the left JP drain is noted to have been pulled through.  It is no longer attached to the patient's skin.  The channels of the JP drain are noted to be sticking out through the drain site.  There are no signs of infection on exam.  The left JP drain was cleaned with Vashe.  I was able to push the drain back into the surgical space to the point of the black dot.  At this time, it was noted that the drain was able to hold suction.  Skin was cleaned with alcohol.  1.5 cc mixture of 50% of lidocaine  with epi and 50% of quarter percent Marcaine  was injected into the skin.  Using sterile gloves, the area was then cleaned with Betadine.  The drain was secured with a 2-0 silk.   Gauze and Tegaderm were placed over the drain site.  A new bulb was placed on the end of the drain.  Patient tolerated the procedure well.  I discussed with the patient to use extreme caution with her drain as to avoid it coming out again.  Patient to continue with compression.  She expressed understanding.  Patient to follow-up at her next scheduled appointment.  Instructed her to call in the meantime if she has any questions that or concerns about anything.

## 2023-10-11 ENCOUNTER — Encounter: Payer: Self-pay | Admitting: Plastic Surgery

## 2023-10-11 ENCOUNTER — Ambulatory Visit (INDEPENDENT_AMBULATORY_CARE_PROVIDER_SITE_OTHER): Admitting: Plastic Surgery

## 2023-10-11 VITALS — BP 128/78 | HR 64 | Temp 99.0°F | Ht 66.0 in | Wt 287.0 lb

## 2023-10-11 DIAGNOSIS — M793 Panniculitis, unspecified: Secondary | ICD-10-CM

## 2023-10-11 NOTE — Progress Notes (Signed)
 The patient is a 38 year old female here for follow-up after undergoing a panniculectomy on August 20.  She had over 4000 g removed from her abdomen and an additional 450 cc removed from her flanks.  The drain output in the left flank is minimal so I was able to go ahead and take that out.  I have instructed her to continue with the binder and clean the drain site with Vashe.  I would like to see the patient back in 1 to 2 weeks.

## 2023-10-25 ENCOUNTER — Ambulatory Visit: Admitting: Student

## 2023-10-25 VITALS — BP 125/74 | HR 70

## 2023-10-25 DIAGNOSIS — Z9889 Other specified postprocedural states: Secondary | ICD-10-CM

## 2023-10-25 NOTE — Progress Notes (Signed)
 Patient is a 38 year old female who underwent panniculectomy with Dr. Lowery on 09/07/2023.  Patient is about 7 weeks postop.  She presents to the clinic today for postoperative follow-up.         Patient was last seen in the clinic on 10/11/2023.  At this visit, the drain output in the left flank was minimal so it was removed.  It was recommended that she continue with the binder and clean the drain site with Vashe.  Today, patient reports she is overall doing well.  She denies any new issues or concerns today.  She states that she feels like she has to hold her side when she is walking, states that she feels like this is a habit that she developed after surgery.  She otherwise denies any new issues or concerns.  She denies any fevers or chills.  She denies any drainage.  Chaperone present on exam.  On exam, patient is sitting upright in no acute distress.  Abdomen is overall soft and nontender.  There is a small area of firmness noted to the right inferior abdomen consistent with some scar tissue, possibly some fat necrosis.  There is no overlying erythema or skin changes.  There are no obvious fluid collections on exam.  Incision with otherwise appears to be well-healed.  There are no signs of infection on exam.  Recommended that patient massage to the area of firmness 2 times daily.  Discussed with her to continue to monitor the area.  Discussed with her that if it is still present in a few months, she may reach back out to us .  Patient expressed understanding.  Recommended that she apply scar creams to her incision daily.  Discussed with the patient that she no longer has to wear her compression and may start gradually increasing her activities.  Patient expressed understanding.  Patient to follow back up as needed.  Instructed the patient to call if she has any questions or concerns about anything.  Pictures were obtained of the patient and placed in the chart with the patient's or  guardian's permission.

## 2023-10-27 ENCOUNTER — Emergency Department (HOSPITAL_COMMUNITY)
Admission: EM | Admit: 2023-10-27 | Discharge: 2023-10-28 | Disposition: A | Discharge status: Home / Self Care | Source: Ambulatory Visit

## 2023-10-27 ENCOUNTER — Encounter (HOSPITAL_COMMUNITY): Payer: Self-pay

## 2023-10-27 ENCOUNTER — Other Ambulatory Visit: Payer: Self-pay

## 2023-10-27 DIAGNOSIS — I10 Essential (primary) hypertension: Secondary | ICD-10-CM | POA: Diagnosis not present

## 2023-10-27 DIAGNOSIS — D649 Anemia, unspecified: Secondary | ICD-10-CM | POA: Diagnosis present

## 2023-10-27 DIAGNOSIS — J45909 Unspecified asthma, uncomplicated: Secondary | ICD-10-CM | POA: Insufficient documentation

## 2023-10-27 DIAGNOSIS — Z91040 Latex allergy status: Secondary | ICD-10-CM | POA: Insufficient documentation

## 2023-10-27 DIAGNOSIS — Z79899 Other long term (current) drug therapy: Secondary | ICD-10-CM | POA: Insufficient documentation

## 2023-10-27 LAB — CBC WITH DIFFERENTIAL/PLATELET
Abs Immature Granulocytes: 0.01 K/uL (ref 0.00–0.07)
Basophils Absolute: 0 K/uL (ref 0.0–0.1)
Basophils Relative: 1 %
Eosinophils Absolute: 0.2 K/uL (ref 0.0–0.5)
Eosinophils Relative: 5 %
HCT: 26.3 % — ABNORMAL LOW (ref 36.0–46.0)
Hemoglobin: 7.2 g/dL — ABNORMAL LOW (ref 12.0–15.0)
Immature Granulocytes: 0 %
Lymphocytes Relative: 33 %
Lymphs Abs: 1.2 K/uL (ref 0.7–4.0)
MCH: 18 pg — ABNORMAL LOW (ref 26.0–34.0)
MCHC: 27.4 g/dL — ABNORMAL LOW (ref 30.0–36.0)
MCV: 65.8 fL — ABNORMAL LOW (ref 80.0–100.0)
Monocytes Absolute: 0.5 K/uL (ref 0.1–1.0)
Monocytes Relative: 14 %
Neutro Abs: 1.8 K/uL (ref 1.7–7.7)
Neutrophils Relative %: 47 %
Platelets: 228 K/uL (ref 150–400)
RBC: 4 MIL/uL (ref 3.87–5.11)
RDW: 21.7 % — ABNORMAL HIGH (ref 11.5–15.5)
Smear Review: NORMAL
WBC: 3.8 K/uL — ABNORMAL LOW (ref 4.0–10.5)
nRBC: 0 % (ref 0.0–0.2)

## 2023-10-27 LAB — COMPREHENSIVE METABOLIC PANEL WITH GFR
ALT: 8 U/L (ref 0–44)
AST: 17 U/L (ref 15–41)
Albumin: 3.5 g/dL (ref 3.5–5.0)
Alkaline Phosphatase: 93 U/L (ref 38–126)
Anion gap: 8 (ref 5–15)
BUN: 8 mg/dL (ref 6–20)
CO2: 24 mmol/L (ref 22–32)
Calcium: 8.6 mg/dL — ABNORMAL LOW (ref 8.9–10.3)
Chloride: 107 mmol/L (ref 98–111)
Creatinine, Ser: 0.65 mg/dL (ref 0.44–1.00)
GFR, Estimated: >60 mL/min (ref >60)
Glucose, Bld: 85 mg/dL (ref 70–99)
Potassium: 3.9 mmol/L (ref 3.5–5.1)
Sodium: 140 mmol/L (ref 135–145)
Total Bilirubin: 0.4 mg/dL (ref 0.0–1.2)
Total Protein: 6.1 g/dL — ABNORMAL LOW (ref 6.5–8.1)

## 2023-10-27 LAB — FERRITIN: Ferritin: 8 ng/mL — ABNORMAL LOW (ref 11–307)

## 2023-10-27 LAB — IRON AND TIBC
Iron: 12 ug/dL — ABNORMAL LOW (ref 28–170)
Saturation Ratios: 3 % — ABNORMAL LOW (ref 10.4–31.8)
TIBC: 399 ug/dL (ref 250–450)
UIBC: 388 ug/dL

## 2023-10-27 LAB — PREPARE RBC (CROSSMATCH)

## 2023-10-27 LAB — TRANSFERRIN: Transferrin: 284 mg/dL (ref 192–382)

## 2023-10-27 LAB — ABO/RH: ABO/RH(D): A POS

## 2023-10-27 LAB — VITAMIN B12: Vitamin B-12: 219 pg/mL (ref 180–914)

## 2023-10-27 LAB — POC OCCULT BLOOD, ED: Fecal Occult Bld: NEGATIVE

## 2023-10-27 MED ORDER — SODIUM CHLORIDE 0.9% IV SOLUTION: Freq: Once | INTRAVENOUS | Status: AC

## 2023-10-27 NOTE — ED Provider Notes (Signed)
 Home EMERGENCY DEPARTMENT AT [facility] Provider Note   CSN: [id number] Arrival date & time: 10/27/23  1142     Patient presents with: Abnormal Lab   Shannon Huffman is a 38 y.o. female status post panniculectomy 8/25 presents with complaints of anemia.  Patient reports that she has felt fatigued since her surgery in August.  She was referred here from her PCP after being found to have a hemoglobin of 7.5.  She denies any chest pain, abdominal pain or shortness of breath.  No hematochezia or melena.  No history of GI bleed.  Is not on blood thinners.  Reports that she did require a iron transfusion back in June.    Abnormal Lab  Past Medical History:  Diagnosis Date   Asthma    Epilepsy (HCC)    Hypertension 06/2016   Morbid obesity (HCC)    Seizures (HCC)    Sleep apnea    Past Surgical History:  Procedure Laterality Date   LAPAROSCOPIC GASTRIC SLEEVE RESECTION  10/08/2019   PANNICULECTOMY Bilateral 09/07/2023   Procedure: PANNICULECTOMY;  Surgeon: Lowery Estefana RAMAN, DO;  Location: [facility];  Service: Plastics;  Laterality: Bilateral;  With Liposuction   right knee surgery Right 01/19/2012   TONSILLECTOMY AND ADENOIDECTOMY  childhood       Prior to Admission medications   Medication Sig Start Date End Date Taking? Authorizing Provider  albuterol (PROVENTIL HFA;VENTOLIN HFA) 108 (90 BASE) MCG/ACT inhaler Inhale 2 puffs into the lungs every 6 (six) hours as needed for wheezing or shortness of breath.     [provider]  atenolol (TENORMIN) 50 MG tablet Take 50 mg by mouth daily.    [provider]  cholecalciferol (VITAMIN D) 1000 units tablet Take 1,000 Units by mouth daily.    [provider]  hydrochlorothiazide (HYDRODIURIL) 25 MG tablet Take 25 mg by mouth daily.    [provider]  mometasone (NASONEX) 50 MCG/ACT nasal spray Place 2 sprays into the nose daily.    [provider]   ondansetron  (ZOFRAN ) 4 MG tablet Take 1 tablet (4 mg total) by mouth every 8 (eight) hours as needed for nausea or vomiting. 08/31/23   Scheeler, Donnice PARAS, PA-C  oxyCODONE -acetaminophen  (PERCOCET) 10-325 MG tablet Take 1 tablet by mouth 3 (three) times daily.    [provider]  phentermine (ADIPEX-P) 37.5 MG tablet Take 37.5 mg by mouth every morning. 12/14/16   [provider]  traMADol  (ULTRAM ) 50 MG tablet Take 50 mg by mouth every 6 (six) hours as needed for moderate pain.    [provider]  WEGOVY 1.7 MG/0.75ML SOAJ Inject 1.7 mg into the skin once a week.    [provider]    Allergies: Adhesive [tape], Clindamycin/lincomycin, and Latex    Review of Systems  Constitutional:  Positive for fatigue.    Updated Vital Signs BP 134/80 (BP Location: Left Arm)   Pulse 73   Temp 99.2 F (37.3 C) (Oral)   Resp 18   Ht 5' 4 (1.626 m)   Wt 125.2 kg   SpO2 100%   BMI 47.38 kg/m   Physical Exam Vitals and nursing note reviewed.  Constitutional:      General: She is not in acute distress.    Appearance: She is well-developed.  HENT:     Head: Normocephalic and atraumatic.  Eyes:     Conjunctiva/sclera: Conjunctivae normal.  Cardiovascular:     Rate and Rhythm: Normal  rate and regular rhythm.     Heart sounds: No murmur heard. Pulmonary:     Effort: Pulmonary effort is normal. No respiratory distress.     Breath sounds: Normal breath sounds.  Abdominal:     Palpations: Abdomen is soft.     Tenderness: There is no abdominal tenderness.  Musculoskeletal:     Cervical back: Neck supple.  Skin:    General: Skin is warm and dry.     Capillary Refill: Capillary refill takes less than 2 seconds.  Neurological:     Mental Status: She is alert.  Psychiatric:        Mood and Affect: Mood normal.     (all labs ordered are listed, but only abnormal results are displayed) Labs Reviewed  CBC WITH DIFFERENTIAL/PLATELET - Abnormal; Notable for  the following components:      Result Value   WBC 3.8 (*)    Hemoglobin 7.2 (*)    HCT 26.3 (*)    MCV 65.8 (*)    MCH 18.0 (*)    MCHC 27.4 (*)    RDW 21.7 (*)    All other components within normal limits  COMPREHENSIVE METABOLIC PANEL WITH GFR - Abnormal; Notable for the following components:   Calcium 8.6 (*)    Total Protein 6.1 (*)    All other components within normal limits  IRON AND TIBC  FERRITIN  VITAMIN B12  TRANSFERRIN  POC OCCULT BLOOD, ED  TYPE AND SCREEN  ABO/RH  PREPARE RBC (CROSSMATCH)    EKG: None  Radiology: No results found.   Procedures   Medications Ordered in the ED  0.9 %  sodium chloride  infusion (Manually program via Guardrails IV Fluids) (has no administration in time range)    Clinical Course as of 10/27/23 1511  Thu Oct 27, 2023  1327 Patient evaluated for complaints of generalized fatigue and anemia.  She is hemodynamically stable.  Her hemoglobin is down to 7.2.  Chart review demonstrates hemoglobin of 8 in June.  Appears that she has had a gradual decline without any precipitous significant drop.  Will obtain Hemoccult for further evaluation. [JT]  1355 POC occult blood, ED Hemoccult negative. [JT]  1355 Given the gradual decline of hemoglobin we will provide a transfusion of PRBCs while here in the ED and attempt to discharge.  Will obtain anemia panel to assist in outpatient management. [JT]    Clinical Course User Index [JT] [name], PA-C                                 Medical Decision Making Amount and/or Complexity of Data Reviewed Labs: ordered. Decision-making details documented in ED Course.  Risk Prescription drug management.   This patient presents to the ED with chief complaint(s) of fatigue .  The complaint involves an extensive differential diagnosis and also carries with it a high risk of complications and morbidity.   Pertinent past medical history as listed in HPI  The differential diagnosis  includes  Patient has no hematochezia or melena, Hemoccult was negative.  Do not suspect GI bleed.  She has no chest pain or shortness of breath.  Do not suspect PE.  Additional history obtained: Records reviewed Care Everywhere/External Records  Disposition:   Signout given to [facility], PA-C.  Please see her note for remainder the visit.  Disposition pending workup.  Social Determinants of Health:   none  This  note was dictated with voice recognition software.  Despite best efforts at proofreading, errors may have occurred which can change the documentation meaning.       Final diagnoses:  Symptomatic anemia    ED Discharge Orders     None          [name], PA-C 10/27/23 1520    Neysa Caron PARAS, [place] 10/27/23 1542

## 2023-10-27 NOTE — ED Triage Notes (Signed)
 Pt states that per her provider her Hgb is 7.5. Pt reports feeling more tired.

## 2023-10-27 NOTE — ED Notes (Signed)
 BLOOD BANKS SAID UNITS OF BLOOD READY - NOTIFIED AMANDIP RN. AT 4:40

## 2023-10-27 NOTE — ED Provider Notes (Signed)
  Physical Exam  BP 134/80 (BP Location: Left Arm)   Pulse 73   Temp 99.2 F (37.3 C) (Oral)   Resp 18   Ht 5' 4 (1.626 m)   Wt 125.2 kg   SpO2 100%   BMI 47.38 kg/m   Physical Exam Vitals and nursing note reviewed.  Constitutional:      General: She is not in acute distress.    Appearance: She is not ill-appearing or toxic-appearing.  HENT:     Mouth/Throat:     Mouth: Mucous membranes are moist.  Cardiovascular:     Rate and Rhythm: Normal rate.  Pulmonary:     Effort: Pulmonary effort is normal. No respiratory distress.  Skin:    General: Skin is warm and dry.  Neurological:     General: No focal deficit present.     Mental Status: She is alert.     Procedures  Procedures  ED Course / MDM   Clinical Course as of 10/27/23 2018  Thu Oct 27, 2023  1327 Patient evaluated for complaints of generalized fatigue and anemia.  She is hemodynamically stable.  Her hemoglobin is down to 7.2.  Chart review demonstrates hemoglobin of 8 in June.  Appears that she has had a gradual decline without any precipitous significant drop.  Will obtain Hemoccult for further evaluation. [JT]  1355 POC occult blood, ED Hemoccult negative. [JT]  1355 Given the gradual decline of hemoglobin we will provide a transfusion of PRBCs while here in the ED and attempt to discharge.  Will obtain anemia panel to assist in outpatient management. [JT]  1630 Spoke with Dr. Audery, does not see need for iron infusion if giving blood. Can follow up outpatient for this.  [RR]    Clinical Course User Index [JT] [name], PA-C [RR] Bernis Ernst, PA-C   Medical Decision Making Amount and/or Complexity of Data Reviewed Labs: ordered. Decision-making details documented in ED Course.  Risk Prescription drug management.   Accepted handoff at shift change from [facility]. Please see prior provider note for more detail.   Briefly: Patient is 38 y.o. F presenting for generalized  weakness/fatigue, h/o anemia, history of iron transfusions, was sent over from PCP for low hemoglobin.  Fecal occult is negative.  Previous shift does not feel admission is needed and can be discharged after blood transfusion that she is not having any chest pain or shortness of breath and she is having generalized fatigue.  Plan is to wait for blood transfusion.  I have consulted heme-onc here and so Dr. Cloretta. Given that she is receiving blood, he does not see a need for her to have iron as well. Will recommend that she follows up with her outpatient provider.   Patient was ordered 2 units from previous provider for anemia.  Blood transfusion is finished.  She is hemodynamically stable.  Reports that she is feeling better.  Afebrile.  No chest pain or shortness of breath.  Will discharge home and have follow-up with her surgery team who currently manages her iron-deficiency anemia.  Return precautions and strictly symptoms discussed with patient.  Verbalizes understanding agrees with plan.  Patient stable for discharge home.      Bernis Ernst, PA-C 10/28/23 0121    Franklyn Sid SAILOR, MD 10/28/23 [phone]

## 2023-10-28 LAB — TYPE AND SCREEN
ABO/RH(D): A POS
Antibody Screen: NEGATIVE
Unit division: 0
Unit division: 0

## 2023-10-28 LAB — BPAM RBC
Blood Product Expiration Date: 202510292359
Blood Product Expiration Date: 202511012359
ISSUE DATE / TIME: 202510091743
ISSUE DATE / TIME: 202510092035
Unit Type and Rh: 6200
Unit Type and Rh: 6200

## 2023-10-28 NOTE — Discharge Instructions (Addendum)
 You were seen in the ER today for evaluation of your symptoms. Your blood counts were low and you received blood here. Please follow up with your specialist that manages your iron infusions. I have included additional information into the discharge paperwork. If you have any concerns, new or worsening symptoms, please return to the nearest ER for re-evaluation.    Contact a health care provider if: You develop new bleeding anywhere in the body. You are very weak. Get help right away if: You are short of breath. You have pain in your abdomen or chest. You are dizzy or feel faint. You have trouble concentrating. You have bloody stools, black stools, or tarry stools. You vomit repeatedly or you vomit up blood. These symptoms may be an emergency. Get help right away. Call 911. Do not wait to see if the symptoms will go away. Do not drive yourself to the hospital.
